# Patient Record
Sex: Male | Born: 1980 | Race: Black or African American | Hispanic: No | Smoking: Current every day smoker
Health system: Southern US, Community
[De-identification: ages and names within clinical notes are randomized; demographics above are authoritative.]

## PROBLEM LIST (undated history)

## (undated) DIAGNOSIS — E119 Type 2 diabetes mellitus without complications: Secondary | ICD-10-CM

---

## 2011-12-12 ENCOUNTER — Encounter (HOSPITAL_BASED_OUTPATIENT_CLINIC_OR_DEPARTMENT_OTHER): Payer: Self-pay | Admitting: Emergency Medicine

## 2011-12-12 ENCOUNTER — Emergency Department (HOSPITAL_BASED_OUTPATIENT_CLINIC_OR_DEPARTMENT_OTHER)
Admission: EM | Admit: 2011-12-12 | Discharge: 2011-12-12 | Disposition: A | Discharge status: Home / Self Care | Payer: Self-pay | Attending: Emergency Medicine | Admitting: Emergency Medicine

## 2011-12-12 DIAGNOSIS — M25469 Effusion, unspecified knee: Secondary | ICD-10-CM | POA: Insufficient documentation

## 2011-12-12 DIAGNOSIS — M7050 Other bursitis of knee, unspecified knee: Secondary | ICD-10-CM

## 2011-12-12 DIAGNOSIS — M25569 Pain in unspecified knee: Secondary | ICD-10-CM | POA: Insufficient documentation

## 2011-12-12 DIAGNOSIS — F17200 Nicotine dependence, unspecified, uncomplicated: Secondary | ICD-10-CM | POA: Insufficient documentation

## 2011-12-12 NOTE — Discharge Instructions (Signed)
Bursitis  Bursitis is a swelling and soreness (inflammation) of a fluid-filled sac (bursa) that overlies and protects a joint. It can be caused by injury, overuse of the joint, arthritis or infection. The joints most likely to be affected are the elbows, shoulders, hips and knees.  HOME CARE INSTRUCTIONS    Apply ice to the affected area for 15 to 20 minutes each hour while awake for 2 days. Put the ice in a plastic bag and place a towel between the bag of ice and your skin.   Rest the injured joint as much as possible, but continue to put the joint through a full range of motion, 4 times per day. (The shoulder joint especially becomes rapidly "frozen" if not used.) When the pain lessens, begin normal slow movements and usual activities.   Only take over-the-counter or prescription medicines for pain, discomfort or fever as directed by your caregiver.   Your caregiver may recommend draining the bursa and injecting medicine into the bursa. This may help the healing process.   Follow all instructions for follow-up with your caregiver. This includes any orthopedic referrals, physical therapy and rehabilitation. Any delay in obtaining necessary care could result in a delay or failure of the bursitis to heal and chronic pain.  SEEK IMMEDIATE MEDICAL CARE IF:    Your pain increases even during treatment.   You develop an oral temperature above 102 F (38.9 C) and have heat and inflammation over the involved bursa.  MAKE SURE YOU:    Understand these instructions.   Will watch your condition.   Will get help right away if you are not doing well or get worse.  Document Released: 09/03/2000 Document Revised: 08/26/2011 Document Reviewed: 08/08/2009  [employer] Patient Information 2012 [employer].

## 2011-12-12 NOTE — ED Notes (Signed)
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

## 2011-12-12 NOTE — ED Provider Notes (Signed)
History     CSN: [id number]  Arrival date & time 12/12/11  0805   First MD Initiated Contact with Patient 12/12/11 [phone]      Chief Complaint  Patient presents with  . Knee Pain    (Consider location/radiation/quality/duration/timing/severity/associated sxs/prior treatment) HPI  Right knee stiff and painful began and Wednesday.  Noted on awakening.  Improved during day- comes and goes.  Worsens with movements.  Patient denies any specific injury but states twists and turns in small kitchen.  Possible swelling, no skin trauma or  Redness or warmth.  No previous problems.   History reviewed. No pertinent past medical history.  History reviewed. No pertinent past surgical history.  No family history on file.  History  Substance Use Topics  . Smoking status: Current Everyday Smoker  . Smokeless tobacco: Not on file  . Alcohol Use: Yes     social      Review of Systems  All other systems reviewed and are negative.    Allergies  Review of patient's allergies indicates no known allergies.  Home Medications  No current outpatient prescriptions on file.  BP 143/87  Pulse 86  Temp(Src) 98.1 F (36.7 C) (Oral)  Resp 16  Ht 6\' 1"  (1.854 m)  Wt 275 lb (124.739 kg)  BMI 36.28 kg/m2  SpO2 97%  Physical Exam  Nursing note and vitals reviewed. Constitutional: He appears well-developed and well-nourished.  HENT:  Head: Normocephalic and atraumatic.  Eyes: Conjunctivae are normal. Pupils are equal, round, and reactive to light.  Neck: Normal range of motion. Neck supple.  Cardiovascular: Normal rate and regular rhythm.   Pulmonary/Chest: Effort normal and breath sounds normal.  Abdominal: Soft. Bowel sounds are normal.  Musculoskeletal: Normal range of motion. He exhibits no edema.       Patient with mild swelling and tenderness under patellar ligament.  No joint swelling or redness or warmth.  No posterior tenerness.  Neurological: He is alert.  Skin: Skin is warm.    Psychiatric: He has a normal mood and affect.    ED Course  Procedures (including critical care time)  Labs Reviewed - No data to display No results found.   No diagnosis found.    MDM          Hilario Quarry, MD 12/19/11 [phone]

## 2013-03-29 ENCOUNTER — Encounter (HOSPITAL_BASED_OUTPATIENT_CLINIC_OR_DEPARTMENT_OTHER): Payer: Self-pay | Admitting: *Deleted

## 2013-03-29 ENCOUNTER — Emergency Department (HOSPITAL_BASED_OUTPATIENT_CLINIC_OR_DEPARTMENT_OTHER)
Admission: EM | Admit: 2013-03-29 | Discharge: 2013-03-29 | Disposition: A | Discharge status: Home / Self Care | Payer: Self-pay | Attending: Emergency Medicine | Admitting: Emergency Medicine

## 2013-03-29 DIAGNOSIS — F17200 Nicotine dependence, unspecified, uncomplicated: Secondary | ICD-10-CM | POA: Insufficient documentation

## 2013-03-29 DIAGNOSIS — K029 Dental caries, unspecified: Secondary | ICD-10-CM | POA: Insufficient documentation

## 2013-03-29 MED ORDER — HYDROCODONE-ACETAMINOPHEN 5-325 MG PO TABS: 2.0000 | ORAL_TABLET | ORAL | Status: DC | PRN

## 2013-03-29 MED ORDER — PENICILLIN V POTASSIUM 500 MG PO TABS: 500.0000 mg | ORAL_TABLET | Freq: Four times a day (QID) | ORAL | Status: AC

## 2013-03-29 NOTE — ED Provider Notes (Signed)
   History    CSN: [id number] Arrival date & time 03/29/13  0127  None    Chief Complaint  Patient presents with  . Dental Pain   (Consider location/radiation/quality/duration/timing/severity/associated sxs/prior Treatment) Patient is a 32 y.o. male presenting with tooth pain. The history is provided by the patient.  Dental Pain Location:  Upper Upper teeth location:  1/RU 3rd molar, 2/RU 2nd molar, 3/RU 1st molar, 4/RU 2nd bicuspid and 5/RU 1st bicuspid Quality:  Aching Severity:  Severe Onset quality:  Gradual Duration:  2 days Timing:  Constant Progression:  Unchanged Chronicity:  New Context: dental fracture and poor dentition   Context: not abscess   Relieved by:  Nothing Worsened by:  Nothing tried Ineffective treatments:  None tried Associated symptoms: no congestion, no fever and no neck swelling   Risk factors: smoking    History reviewed. No pertinent past medical history. History reviewed. No pertinent past surgical history. No family history on file. History  Substance Use Topics  . Smoking status: Current Every Day Smoker  . Smokeless tobacco: Not on file  . Alcohol Use: Yes     Comment: social    Review of Systems  Constitutional: Negative for fever.  HENT: Negative for congestion.   All other systems reviewed and are negative.    Allergies  Review of patient's allergies indicates no known allergies.  Home Medications   Current Outpatient Rx  Name  Route  Sig  Dispense  Refill  . HYDROcodone-acetaminophen (NORCO) 5-325 MG per tablet   Oral   Take 2 tablets by mouth every 4 (four) hours as needed for pain.   10 tablet   0   . penicillin v potassium (VEETID) 500 MG tablet   Oral   Take 1 tablet (500 mg total) by mouth 4 (four) times daily.   40 tablet   0    BP 167/100  Pulse 68  Temp(Src) 98.7 F (37.1 C) (Oral)  Resp 18  Ht 6\' 1"  (1.854 m)  Wt 260 lb (117.935 kg)  BMI 34.31 kg/m2  SpO2 100% Physical Exam  Constitutional: He  is oriented to person, place, and time. He appears well-developed and well-nourished.  HENT:  Head: Normocephalic.  Mouth/Throat: Oropharynx is clear and moist. Dental caries present. No oropharyngeal exudate.    Eyes: Conjunctivae are normal. Pupils are equal, round, and reactive to light.  Neck: Normal range of motion. Neck supple.  Cardiovascular: Normal rate, regular rhythm and intact distal pulses.   Pulmonary/Chest: Effort normal and breath sounds normal. He has no wheezes. He has no rales.  Abdominal: Soft. Bowel sounds are normal. There is no tenderness. There is no rebound and no guarding.  Musculoskeletal: Normal range of motion.  Neurological: He is alert and oriented to person, place, and time.  Skin: Skin is warm and dry.  Psychiatric: He has a normal mood and affect.    ED Course  Procedures (including critical care time) Labs Reviewed - No data to display No results found. 1. Dental caries     MDM  Antibiotics pain medication and follow up with a dentist.   Kortnee Bas K Koa Zoeller-Rasch, MD 03/29/13 0139

## 2013-03-29 NOTE — ED Notes (Signed)
Pt c/o right upper molar pain thatis radiating into his right ear x2 days. Pt sts he has a cracked tooth.

## 2013-03-29 NOTE — ED Notes (Signed)
MD at bedside. 

## 2014-08-07 ENCOUNTER — Emergency Department (HOSPITAL_BASED_OUTPATIENT_CLINIC_OR_DEPARTMENT_OTHER): Payer: Self-pay

## 2014-08-07 ENCOUNTER — Emergency Department (HOSPITAL_BASED_OUTPATIENT_CLINIC_OR_DEPARTMENT_OTHER)
Admission: EM | Admit: 2014-08-07 | Discharge: 2014-08-07 | Disposition: A | Discharge status: Home / Self Care | Payer: Self-pay | Attending: Emergency Medicine | Admitting: Emergency Medicine

## 2014-08-07 ENCOUNTER — Encounter (HOSPITAL_BASED_OUTPATIENT_CLINIC_OR_DEPARTMENT_OTHER): Payer: Self-pay

## 2014-08-07 DIAGNOSIS — T1490XA Injury, unspecified, initial encounter: Secondary | ICD-10-CM

## 2014-08-07 DIAGNOSIS — Y9389 Activity, other specified: Secondary | ICD-10-CM | POA: Insufficient documentation

## 2014-08-07 DIAGNOSIS — Z720 Tobacco use: Secondary | ICD-10-CM | POA: Insufficient documentation

## 2014-08-07 DIAGNOSIS — Y998 Other external cause status: Secondary | ICD-10-CM | POA: Insufficient documentation

## 2014-08-07 DIAGNOSIS — Y9289 Other specified places as the place of occurrence of the external cause: Secondary | ICD-10-CM | POA: Insufficient documentation

## 2014-08-07 DIAGNOSIS — S9032XA Contusion of left foot, initial encounter: Secondary | ICD-10-CM | POA: Insufficient documentation

## 2014-08-07 DIAGNOSIS — W208XXA Other cause of strike by thrown, projected or falling object, initial encounter: Secondary | ICD-10-CM | POA: Insufficient documentation

## 2014-08-07 MED ORDER — NAPROXEN 500 MG PO TABS: 500.0000 mg | ORAL_TABLET | Freq: Two times a day (BID) | ORAL | Status: DC

## 2014-08-07 MED ORDER — HYDROCODONE-ACETAMINOPHEN 5-325 MG PO TABS: 1.0000 | ORAL_TABLET | ORAL | Status: DC | PRN

## 2014-08-07 NOTE — ED Provider Notes (Signed)
CSN: [id number]     Arrival date & time 08/07/14  1602 History   First MD Initiated Contact with Patient 08/07/14 1630     Chief Complaint  Patient presents with  . Foot Injury     (Consider location/radiation/quality/duration/timing/severity/associated sxs/prior Treatment) Patient is a 33 y.o. male presenting with foot injury. The history is provided by the patient.  Foot Injury Location:  Foot Injury: yes   Foot location:  L foot Pain details:    Radiates to:  Does not radiate   Severity:  Severe   Onset quality:  Sudden   Timing:  Constant   Progression:  Worsening [name] [name] is a 33 y.o. male who presents to the ED with pain to the left foot after he dropped a bath tub on his foot 24 hours earlier. He denies any other injuries.   History reviewed. No pertinent past medical history. History reviewed. No pertinent past surgical history. No family history on file. History  Substance Use Topics  . Smoking status: Current Every Day Smoker  . Smokeless tobacco: Not on file  . Alcohol Use: No    Review of Systems Negative except as stated in HPI   Allergies  Review of patient's allergies indicates no known allergies.  Home Medications   Prior to Admission medications   Not on File   BP 163/99 mmHg  Pulse 86  Temp(Src) 98.6 F (37 C) (Oral)  Resp 18  Ht 6\' 1"  (1.854 m)  Wt 247 lb (112.038 kg)  BMI 32.59 kg/m2  SpO2 97% Physical Exam  Constitutional: He is oriented to person, place, and time. He appears well-developed and well-nourished.  HENT:  Head: Normocephalic.  Eyes: EOM are normal.  Neck: Neck supple.  Cardiovascular: Normal rate.   Pulmonary/Chest: Effort normal.  Musculoskeletal: Normal range of motion.       Left foot: There is tenderness and swelling. There is normal range of motion, normal capillary refill, no deformity and no laceration.       Feet:  Tender with ecchymosis to the medial aspect of the left foot. Pedal pulse strong,  adequate circulation, good touch sensation.   Neurological: He is alert and oriented to person, place, and time. No cranial nerve deficit.  Skin: Skin is warm and dry.  Psychiatric: He has a normal mood and affect. His behavior is normal.  Nursing note and vitals reviewed.   ED Course  Procedures ( Buddy tape, ace wrap, post op shoe, ice, elevate and follow up with ortho is symptoms persist.   Dg Foot Complete Left  08/07/2014   CLINICAL DATA:  Left foot pain after a heavy object fell on foot 2 days ago. Lateral great toe pain with abrasion and swelling. Initial encounter.  EXAM: LEFT FOOT - COMPLETE 3+ VIEW  COMPARISON:  None.  FINDINGS: On the lateral radiograph, there is question of a cortical disruption involving the dorsal base of the second toe distal phalanx, however a fracture is not confirmed in other projections and this appearance may be artifactual due to other superimposed tissues. There is no other evidence of acute fracture, and specifically the great toe appears intact. There is no dislocation. No radiopaque foreign body. No lytic or blastic osseous lesion.  IMPRESSION: Artifact versus nondisplaced fracture of the second toe distal phalanx. No acute osseous abnormality identified elsewhere.   Electronically Signed   By: [name] [name]   On: 08/07/2014 17:01    MDM  33 y.o. male with left foot pain  s/p injury 24 hours prior to visit. Stable for discharge with no neurovascular deficits. I have reviewed this patient's vital signs, nurses notes, appropriate imaging and discussed findings and plan of care with the patient. He voices understanding and agrees with plan.        [address] Brown StreetHope Palos HillsM Neese, NP 08/08/14 1605  [name] CamelScott T Goldston, MD 08/14/14 [phone]

## 2014-08-07 NOTE — ED Notes (Signed)
Bathtub fell on left foot yesterday-denies as WC claim

## 2014-08-07 NOTE — Discharge Instructions (Signed)
Do not take the narcotic if you are driving as it will make you sleepy.  °

## 2014-08-07 NOTE — ED Notes (Signed)
Patient preparing for discharge. 

## 2014-08-08 ENCOUNTER — Encounter (HOSPITAL_BASED_OUTPATIENT_CLINIC_OR_DEPARTMENT_OTHER): Payer: Self-pay | Admitting: *Deleted

## 2014-08-09 NOTE — ED Notes (Signed)
Patient called and requested a note to return to work after his scheduled appointment with Dr. Pearletha ForgeHudnall on 08/12/14

## 2014-08-12 ENCOUNTER — Ambulatory Visit: Payer: Self-pay | Admitting: Family Medicine

## 2015-03-14 ENCOUNTER — Encounter (HOSPITAL_BASED_OUTPATIENT_CLINIC_OR_DEPARTMENT_OTHER): Payer: Self-pay | Admitting: Emergency Medicine

## 2015-03-14 ENCOUNTER — Emergency Department (HOSPITAL_BASED_OUTPATIENT_CLINIC_OR_DEPARTMENT_OTHER)
Admission: EM | Admit: 2015-03-14 | Discharge: 2015-03-14 | Disposition: A | Discharge status: Home / Self Care | Payer: 59 | Attending: Emergency Medicine | Admitting: Emergency Medicine

## 2015-03-14 DIAGNOSIS — H9202 Otalgia, left ear: Secondary | ICD-10-CM | POA: Diagnosis present

## 2015-03-14 DIAGNOSIS — Z720 Tobacco use: Secondary | ICD-10-CM | POA: Diagnosis not present

## 2015-03-14 DIAGNOSIS — Z791 Long term (current) use of non-steroidal anti-inflammatories (NSAID): Secondary | ICD-10-CM | POA: Insufficient documentation

## 2015-03-14 DIAGNOSIS — H6092 Unspecified otitis externa, left ear: Secondary | ICD-10-CM | POA: Insufficient documentation

## 2015-03-14 MED ORDER — CIPROFLOXACIN-DEXAMETHASONE 0.3-0.1 % OT SUSP
4.0000 [drp] | Freq: Once | OTIC | Status: AC
  Administered 2015-03-14: 4 [drp] via OTIC
  Filled 2015-03-14: qty 7.5

## 2015-03-14 MED ORDER — NAPROXEN 500 MG PO TABS: 500.0000 mg | ORAL_TABLET | Freq: Two times a day (BID) | ORAL | Status: DC

## 2015-03-14 MED ORDER — IBUPROFEN 800 MG PO TABS
800.0000 mg | ORAL_TABLET | Freq: Once | ORAL | Status: AC
  Administered 2015-03-14: 800 mg via ORAL
  Filled 2015-03-14: qty 1

## 2015-03-14 NOTE — Discharge Instructions (Signed)
Take ciprodex twice daily for 5 days.   Take naprosyn for pain.   Avoid swimming when your ear hurts.  Follow up with your doctor.   Return to ER if you have worse ear pain, fever, purulent drainage from ear.

## 2015-03-14 NOTE — ED Provider Notes (Signed)
CSN: [id number]     Arrival date & time 03/14/15  0027 History   First MD Initiated Contact with Patient 03/14/15 [phone]     Chief Complaint  Patient presents with  . Otalgia     (Consider location/radiation/quality/duration/timing/severity/associated sxs/prior Treatment) The history is provided by the patient.  [name] [name] is a 34 y.o. male here presenting with left ear pain. Left ear pain for the last 5 days. Initially comes and goes but went swimming and got worse. Tried over-the-counter medicine but made it worse. He tried to go to bed today by the year pain woke him up. Denies any fevers or purulent drainage.  History reviewed. No pertinent past medical history. History reviewed. No pertinent past surgical history. History reviewed. No pertinent family history. History  Substance Use Topics  . Smoking status: Current Every Day Smoker -- 1.00 packs/day    Types: Cigarettes  . Smokeless tobacco: Not on file  . Alcohol Use: Yes     Comment: social    Review of Systems  HENT: Positive for ear pain.   All other systems reviewed and are negative.     Allergies  Review of patient's allergies indicates no known allergies.  Home Medications   Prior to Admission medications   Medication Sig Start Date End Date Taking? Authorizing Provider  HYDROcodone-acetaminophen (NORCO) 5-325 MG per tablet Take 2 tablets by mouth every 4 (four) hours as needed for pain. 03/29/13   April Palumbo, MD  HYDROcodone-acetaminophen (NORCO/VICODIN) 5-325 MG per tablet Take 1-2 tablets by mouth every 4 (four) hours as needed. 08/07/14   Hope Orlene Och, NP  naproxen (NAPROSYN) 500 MG tablet Take 1 tablet (500 mg total) by mouth 2 (two) times daily. 08/07/14   Hope Orlene Och, NP   BP 168/91 mmHg  Pulse 80  Temp(Src) 98.1 F (36.7 C) (Oral)  Resp 16  Ht 6\' 1"  (1.854 m)  Wt 250 lb (113.399 kg)  BMI 32.99 kg/m2  SpO2 98% Physical Exam  Constitutional: He is oriented to person, place, and time. He  appears well-developed and well-nourished.  HENT:  Head: Normocephalic.  Mouth/Throat: Oropharynx is clear and moist.  Bilateral TM nl. L canal with slight erythema, ? Mild otitis externa   Eyes: Conjunctivae are normal. Pupils are equal, round, and reactive to light.  Neck: Normal range of motion. Neck supple.  Cardiovascular: Normal rate, regular rhythm, normal heart sounds and intact distal pulses.   Pulmonary/Chest: Effort normal and breath sounds normal. No respiratory distress.  Abdominal: Soft.  Musculoskeletal: Normal range of motion.  Neurological: He is alert and oriented to person, place, and time.  Skin: Skin is warm and dry.  Psychiatric: He has a normal mood and affect. His behavior is normal. Judgment and thought content normal.  Nursing note and vitals reviewed.   ED Course  Procedures (including critical care time) Labs Review Labs Reviewed - No data to display  Imaging Review No results found.   EKG Interpretation None      MDM   Final diagnoses:  None   [name] [name] is a 34 y.o. male here with L ear pain. No signs of otitis media. ? L otitis externa vs irritation. Given ciprodex, motrin. Will dc home with same.      Richardean Canal, MD 03/14/15 [phone]

## 2015-03-14 NOTE — ED Notes (Signed)
Patient reports left ear pain x 5 days.  Reports this went away and then came back after going swimming. Reports he used OTC ear medicine which has since made it worse.

## 2016-04-26 ENCOUNTER — Encounter (HOSPITAL_BASED_OUTPATIENT_CLINIC_OR_DEPARTMENT_OTHER): Payer: Self-pay

## 2016-04-26 ENCOUNTER — Emergency Department (HOSPITAL_BASED_OUTPATIENT_CLINIC_OR_DEPARTMENT_OTHER): Payer: 59

## 2016-04-26 ENCOUNTER — Inpatient Hospital Stay (HOSPITAL_BASED_OUTPATIENT_CLINIC_OR_DEPARTMENT_OTHER)
Admission: EM | Admit: 2016-04-26 | Discharge: 2016-04-30 | DRG: 392 | Disposition: A | Discharge status: Home / Self Care | Payer: 59 | Attending: General Surgery | Admitting: General Surgery

## 2016-04-26 DIAGNOSIS — Z833 Family history of diabetes mellitus: Secondary | ICD-10-CM

## 2016-04-26 DIAGNOSIS — F17210 Nicotine dependence, cigarettes, uncomplicated: Secondary | ICD-10-CM | POA: Diagnosis present

## 2016-04-26 DIAGNOSIS — D72829 Elevated white blood cell count, unspecified: Secondary | ICD-10-CM | POA: Diagnosis present

## 2016-04-26 DIAGNOSIS — E119 Type 2 diabetes mellitus without complications: Secondary | ICD-10-CM | POA: Diagnosis present

## 2016-04-26 DIAGNOSIS — K5720 Diverticulitis of large intestine with perforation and abscess without bleeding: Principal | ICD-10-CM | POA: Diagnosis present

## 2016-04-26 DIAGNOSIS — K5792 Diverticulitis of intestine, part unspecified, without perforation or abscess without bleeding: Secondary | ICD-10-CM | POA: Diagnosis present

## 2016-04-26 DIAGNOSIS — E669 Obesity, unspecified: Secondary | ICD-10-CM | POA: Diagnosis present

## 2016-04-26 DIAGNOSIS — Z6832 Body mass index (BMI) 32.0-32.9, adult: Secondary | ICD-10-CM

## 2016-04-26 DIAGNOSIS — K5780 Diverticulitis of intestine, part unspecified, with perforation and abscess without bleeding: Secondary | ICD-10-CM

## 2016-04-26 DIAGNOSIS — K5700 Diverticulitis of small intestine with perforation and abscess without bleeding: Secondary | ICD-10-CM

## 2016-04-26 HISTORY — DX: Type 2 diabetes mellitus without complications: E11.9

## 2016-04-26 LAB — URINALYSIS, ROUTINE W REFLEX MICROSCOPIC
BILIRUBIN URINE: NEGATIVE
Glucose, UA: NEGATIVE mg/dL
Ketones, ur: NEGATIVE mg/dL
Leukocytes, UA: NEGATIVE
Nitrite: NEGATIVE
PROTEIN: NEGATIVE mg/dL
Specific Gravity, Urine: 1.007 (ref 1.005–1.030)
pH: 6 (ref 5.0–8.0)

## 2016-04-26 LAB — COMPREHENSIVE METABOLIC PANEL
ALT: 29 U/L (ref 17–63)
AST: 24 U/L (ref 15–41)
Albumin: 3.8 g/dL (ref 3.5–5.0)
Alkaline Phosphatase: 96 U/L (ref 38–126)
Anion gap: 10 (ref 5–15)
BILIRUBIN TOTAL: 0.7 mg/dL (ref 0.3–1.2)
BUN: 13 mg/dL (ref 6–20)
CALCIUM: 9.1 mg/dL (ref 8.9–10.3)
CO2: 27 mmol/L (ref 22–32)
CREATININE: 1.18 mg/dL (ref 0.61–1.24)
Chloride: 95 mmol/L — ABNORMAL LOW (ref 101–111)
GFR calc Af Amer: >60 mL/min (ref >60)
Glucose, Bld: 131 mg/dL — ABNORMAL HIGH (ref 65–99)
Potassium: 3.7 mmol/L (ref 3.5–5.1)
Sodium: 132 mmol/L — ABNORMAL LOW (ref 135–145)
TOTAL PROTEIN: 8.3 g/dL — AB (ref 6.5–8.1)

## 2016-04-26 LAB — CBC WITH DIFFERENTIAL/PLATELET
BAND NEUTROPHILS: 6 %
BASOS PCT: 0 %
Basophils Absolute: 0 10*3/uL (ref 0.0–0.1)
EOS ABS: 0 10*3/uL (ref 0.0–0.7)
Eosinophils Relative: 0 %
HCT: 47.2 % (ref 39.0–52.0)
Hemoglobin: 15.8 g/dL (ref 13.0–17.0)
LYMPHS ABS: 3.1 10*3/uL (ref 0.7–4.0)
Lymphocytes Relative: 18 %
MCH: 28.4 pg (ref 26.0–34.0)
MCHC: 33.5 g/dL (ref 30.0–36.0)
MCV: 84.9 fL (ref 78.0–100.0)
MONO ABS: 1 10*3/uL (ref 0.1–1.0)
Monocytes Relative: 6 %
Neutro Abs: 12.9 10*3/uL — ABNORMAL HIGH (ref 1.7–7.7)
Neutrophils Relative %: 70 %
Platelets: 271 10*3/uL (ref 150–400)
RBC: 5.56 MIL/uL (ref 4.22–5.81)
RDW: 13.2 % (ref 11.5–15.5)
WBC: 17 10*3/uL — ABNORMAL HIGH (ref 4.0–10.5)

## 2016-04-26 LAB — URINE MICROSCOPIC-ADD ON: WBC, UA: NONE SEEN WBC/hpf (ref 0–5)

## 2016-04-26 MED ORDER — ACETAMINOPHEN 325 MG PO TABS
650.0000 mg | ORAL_TABLET | Freq: Once | ORAL | Status: AC
  Administered 2016-04-26: 650 mg via ORAL
  Filled 2016-04-26: qty 2

## 2016-04-26 NOTE — ED Notes (Signed)
MD at bedside. 

## 2016-04-26 NOTE — ED Triage Notes (Signed)
C/o abd pain, constipation x 4 days-used OTC laxatives with relief yesterday-NAD-steady gait

## 2016-04-26 NOTE — ED Provider Notes (Signed)
[facility]   CSN: [id number] Arrival date & time: 04/26/16  1925  First Provider Contact:  First MD Initiated Contact with Patient 04/26/16 2242     By signing my name below, I, [name] [name], attest that this documentation has been prepared under the direction and in the presence of Doug SouSam Danylle Ouk, MD. Electronically Signed: Soijett [name], ED Scribe. 04/26/16. 11:00 PM.    History   Chief Complaint Chief Complaint  Patient presents with  . Abdominal Pain    HPI [name] [name] is a 35 y.o. male who presents to the Emergency Department complaining of intermittent left Sided abdominal pain onset 1 week.  Pt states that his abdominal pain is alleviated with a bowel movement. Pt notes that his last bowel movement was yesterday. He states that he is having associated symptoms of constipation. Pt reports that he hasn't passed gas in 4 days and he notes that he has a moderate amount of gas currently. He feels much improved since having a bowel movement here He states that he has tried OTC milk of magnesia with his last dose being yesterday for the relief for his symptoms. Denies  He denies nausea, vomiting, appetite change, no urinary symptoms and any other symptoms. Pt notes that he does smoke cigarettes. Pt endorses ETOH use with his last use 3 days ago. Pt denies illegal drug use. Denies taking any daily medications. Denies allergies to medications. Pt denies having a PCP.    The history is provided by the patient. No language interpreter was used.    History reviewed. No pertinent past medical history.  There are no active problems to display for this patient.   History reviewed. No pertinent surgical history.     Home Medications    Prior to Admission medications   Not on File   Milk of magnesia Family History No family history on file.  Social History Social History  Substance Use Topics  . Smoking status: Current Every Day Smoker   Packs/day: 1.00    Types: Cigarettes  . Smokeless tobacco: Never Used  . Alcohol use Yes     Comment: occ    Denies drug use Allergies   Review of patient's allergies indicates no known allergies.   Review of Systems Review of Systems  Constitutional: Negative.   HENT: Negative.   Respiratory: Negative.   Cardiovascular: Negative.   Gastrointestinal: Positive for abdominal pain and constipation.  Musculoskeletal: Negative.   Skin: Negative.   Neurological: Negative.   Psychiatric/Behavioral: Negative.   All other systems reviewed and are negative.    Physical Exam Updated Vital Signs BP 144/78 (BP Location: Right Arm)   Pulse 110   Temp 101.3 F (38.5 C) (Oral)   Resp 18   Ht 6\' 1"  (1.854 m)   Wt 246 lb (111.6 kg)   SpO2 98%   BMI 32.46 kg/m   Physical Exam  Constitutional: He appears well-developed and well-nourished. No distress.  HENT:  Head: Normocephalic and atraumatic.  Eyes: Conjunctivae are normal. Pupils are equal, round, and reactive to light.  Neck: Neck supple. No tracheal deviation present. No thyromegaly present.  Cardiovascular: Regular rhythm.   No murmur heard. Mildly tachycardic  Pulmonary/Chest: Effort normal and breath sounds normal.  Abdominal: Soft. Bowel sounds are normal. He exhibits no distension. There is no tenderness.  Obese  Genitourinary: Penis normal.  Genitourinary Comments: Scrotum normal  Musculoskeletal: Normal range of motion. He exhibits no edema or tenderness.  Neurological: He is  alert. Coordination normal.  Skin: Skin is warm and dry. No rash noted.  Psychiatric: He has a normal mood and affect.  Nursing note and vitals reviewed.    ED Treatments / Results  DIAGNOSTIC STUDIES: Oxygen Saturation is 98% on RA, nl by my interpretation.    COORDINATION OF CARE: 10:59 PM Discussed treatment plan with pt at bedside which includes labs, UA, abdomen xray, CT abdomen with pelvis, and pt agreed to plan.   Labs (all  labs ordered are listed, but only abnormal results are displayed) Labs Reviewed  CBC WITH DIFFERENTIAL/PLATELET - Abnormal; Notable for the following:       Result Value   WBC 17.0 (*)    Neutro Abs 12.9 (*)    All other components within normal limits  COMPREHENSIVE METABOLIC PANEL - Abnormal; Notable for the following:    Sodium 132 (*)    Chloride 95 (*)    Glucose, Bld 131 (*)    Total Protein 8.3 (*)    All other components within normal limits  URINALYSIS, ROUTINE W REFLEX MICROSCOPIC (NOT AT [facility]) - Abnormal; Notable for the following:    Hgb urine dipstick MODERATE (*)    All other components within normal limits  URINE MICROSCOPIC-ADD ON - Abnormal; Notable for the following:    Squamous Epithelial / LPF 0-5 (*)    Bacteria, UA FEW (*)    All other components within normal limits    EKG  EKG Interpretation None       Radiology Dg Abdomen 1 View  Result Date: 04/26/2016 CLINICAL DATA:  Constipation.  Lower abdominal pain. EXAM: ABDOMEN - 1 VIEW COMPARISON:  None. FINDINGS: Upper abdomen not included in the field of view. Small volume of colonic stool. No small bowel dilatation to suggest obstruction. No abnormal calcifications. Included osseous structures are intact. IMPRESSION: Small stool burden.  No evidence of obstruction. Electronically Signed   By: Rubye Oaks M.D.   On: 04/26/2016 21:18    Procedures Procedures (including critical care time)  Medications Ordered in ED Medications  acetaminophen (TYLENOL) tablet 650 mg (650 mg Oral Given 04/26/16 2153)    Results for orders placed or performed during the hospital encounter of 04/26/16  CBC with Differential  Result Value Ref Range   WBC 17.0 (H) 4.0 - 10.5 K/uL   RBC 5.56 4.22 - 5.81 MIL/uL   Hemoglobin 15.8 13.0 - 17.0 g/dL   HCT 04.5 40.9 - 81.1 %   MCV 84.9 78.0 - 100.0 fL   MCH 28.4 26.0 - 34.0 pg   MCHC 33.5 30.0 - 36.0 g/dL   RDW 91.4 78.2 - 95.6 %   Platelets 271 150 - 400 K/uL    Neutrophils Relative % 70 %   Lymphocytes Relative 18 %   Monocytes Relative 6 %   Eosinophils Relative 0 %   Basophils Relative 0 %   Band Neutrophils 6 %   Neutro Abs 12.9 (H) 1.7 - 7.7 K/uL   Lymphs Abs 3.1 0.7 - 4.0 K/uL   Monocytes Absolute 1.0 0.1 - 1.0 K/uL   Eosinophils Absolute 0.0 0.0 - 0.7 K/uL   Basophils Absolute 0.0 0.0 - 0.1 K/uL   WBC Morphology ATYPICAL LYMPHOCYTES   Comprehensive metabolic panel  Result Value Ref Range   Sodium 132 (L) 135 - 145 mmol/L   Potassium 3.7 3.5 - 5.1 mmol/L   Chloride 95 (L) 101 - 111 mmol/L   CO2 27 22 - 32 mmol/L   Glucose, Bld  131 (H) 65 - 99 mg/dL   BUN 13 6 - 20 mg/dL   Creatinine, Ser 9.52 0.61 - 1.24 mg/dL   Calcium 9.1 8.9 - 84.1 mg/dL   Total Protein 8.3 (H) 6.5 - 8.1 g/dL   Albumin 3.8 3.5 - 5.0 g/dL   AST 24 15 - 41 U/L   ALT 29 17 - 63 U/L   Alkaline Phosphatase 96 38 - 126 U/L   Total Bilirubin 0.7 0.3 - 1.2 mg/dL   GFR calc non Af Amer >60 >60 mL/min   GFR calc Af Amer >60 >60 mL/min   Anion gap 10 5 - 15  Urinalysis, Routine w reflex microscopic (not at [facility])  Result Value Ref Range   Color, Urine YELLOW YELLOW   APPearance CLEAR CLEAR   Specific Gravity, Urine 1.007 1.005 - 1.030   pH 6.0 5.0 - 8.0   Glucose, UA NEGATIVE NEGATIVE mg/dL   Hgb urine dipstick MODERATE (A) NEGATIVE   Bilirubin Urine NEGATIVE NEGATIVE   Ketones, ur NEGATIVE NEGATIVE mg/dL   Protein, ur NEGATIVE NEGATIVE mg/dL   Nitrite NEGATIVE NEGATIVE   Leukocytes, UA NEGATIVE NEGATIVE  Urine microscopic-add on  Result Value Ref Range   Squamous Epithelial / LPF 0-5 (A) NONE SEEN   WBC, UA NONE SEEN 0 - 5 WBC/hpf   RBC / HPF 0-5 0 - 5 RBC/hpf   Bacteria, UA FEW (A) NONE SEEN   Dg Abdomen 1 View  Result Date: 04/26/2016 CLINICAL DATA:  Constipation.  Lower abdominal pain. EXAM: ABDOMEN - 1 VIEW COMPARISON:  None. FINDINGS: Upper abdomen not included in the field of view. Small volume of colonic stool. No small bowel dilatation to suggest  obstruction. No abnormal calcifications. Included osseous structures are intact. IMPRESSION: Small stool burden.  No evidence of obstruction. Electronically Signed   By: Rubye Oaks M.D.   On: 04/26/2016 21:18   Initial Impression / Assessment and Plan / ED Course  I have reviewed the triage vital signs and the nursing notes.  Pertinent labs & imaging results that were available during my care of the patient were reviewed by me and considered in my medical decision making (see chart for details).  Clinical Course  In light of left-sided abdominal pain leukocytosis and fever CT scan of abdomen and pelvis ordered. Pt signed out to Dr Nicanor Alcon at 1210 am    Final Clinical Impressions(s) / ED Diagnoses   Final diagnoses:  None    New Prescriptions New Prescriptions   No medications on file  Dx #1 abdominal pain #2 fever #3 leukocytosis       Doug Sou, MD 04/27/16 3244

## 2016-04-27 DIAGNOSIS — K5792 Diverticulitis of intestine, part unspecified, without perforation or abscess without bleeding: Secondary | ICD-10-CM | POA: Diagnosis present

## 2016-04-27 LAB — CBC
HEMATOCRIT: 43.4 % (ref 39.0–52.0)
Hemoglobin: 14.2 g/dL (ref 13.0–17.0)
MCH: 28.1 pg (ref 26.0–34.0)
MCHC: 32.7 g/dL (ref 30.0–36.0)
MCV: 85.8 fL (ref 78.0–100.0)
Platelets: 279 10*3/uL (ref 150–400)
RBC: 5.06 MIL/uL (ref 4.22–5.81)
RDW: 13.4 % (ref 11.5–15.5)
WBC: 16.1 10*3/uL — ABNORMAL HIGH (ref 4.0–10.5)

## 2016-04-27 LAB — CREATININE, SERUM
Creatinine, Ser: 1.13 mg/dL (ref 0.61–1.24)
GFR calc Af Amer: >60 mL/min (ref >60)

## 2016-04-27 MED ORDER — PIPERACILLIN-TAZOBACTAM 3.375 G IVPB
3.3750 g | Freq: Three times a day (TID) | INTRAVENOUS | Status: DC
  Administered 2016-04-27 – 2016-04-30 (×10): 3.375 g via INTRAVENOUS
  Filled 2016-04-27 (×11): qty 50

## 2016-04-27 MED ORDER — DIPHENHYDRAMINE HCL 50 MG/ML IJ SOLN: 25.0000 mg | Freq: Four times a day (QID) | INTRAMUSCULAR | Status: DC | PRN

## 2016-04-27 MED ORDER — DIPHENHYDRAMINE HCL 25 MG PO CAPS: 25.0000 mg | ORAL_CAPSULE | Freq: Four times a day (QID) | ORAL | Status: DC | PRN

## 2016-04-27 MED ORDER — ACETAMINOPHEN 325 MG PO TABS: 650.0000 mg | ORAL_TABLET | Freq: Four times a day (QID) | ORAL | Status: DC | PRN

## 2016-04-27 MED ORDER — IOPAMIDOL (ISOVUE-300) INJECTION 61%
100.0000 mL | Freq: Once | INTRAVENOUS | Status: AC | PRN
  Administered 2016-04-27: 100 mL via INTRAVENOUS

## 2016-04-27 MED ORDER — KETOROLAC TROMETHAMINE 30 MG/ML IJ SOLN: 30.0000 mg | Freq: Four times a day (QID) | INTRAMUSCULAR | Status: DC | PRN

## 2016-04-27 MED ORDER — ACETAMINOPHEN 650 MG RE SUPP
650.0000 mg | Freq: Four times a day (QID) | RECTAL | Status: DC | PRN
Start: 2016-04-27 — End: 2016-04-30

## 2016-04-27 MED ORDER — ACETAMINOPHEN 500 MG PO TABS
1000.0000 mg | ORAL_TABLET | Freq: Once | ORAL | Status: AC
  Administered 2016-04-27: 1000 mg via ORAL
  Filled 2016-04-27: qty 2

## 2016-04-27 MED ORDER — ENOXAPARIN SODIUM 40 MG/0.4ML ~~LOC~~ SOLN
40.0000 mg | SUBCUTANEOUS | Status: DC
  Filled 2016-04-27 (×2): qty 0.4

## 2016-04-27 MED ORDER — ONDANSETRON HCL 4 MG/2ML IJ SOLN: 4.0000 mg | Freq: Three times a day (TID) | INTRAMUSCULAR | Status: DC | PRN

## 2016-04-27 MED ORDER — PIPERACILLIN-TAZOBACTAM 3.375 G IVPB 30 MIN
3.3750 g | Freq: Once | INTRAVENOUS | Status: AC
  Administered 2016-04-27: 3.375 g via INTRAVENOUS
  Filled 2016-04-27 (×2): qty 50

## 2016-04-27 MED ORDER — ONDANSETRON HCL 4 MG/2ML IJ SOLN
4.0000 mg | Freq: Four times a day (QID) | INTRAMUSCULAR | Status: AC
  Administered 2016-04-27: 4 mg via INTRAVENOUS
  Filled 2016-04-27: qty 2

## 2016-04-27 MED ORDER — SODIUM CHLORIDE 0.9 % IV SOLN
INTRAVENOUS | Status: DC
  Administered 2016-04-27: 1000 mL via INTRAVENOUS
  Administered 2016-04-27: 13:00:00 via INTRAVENOUS
  Administered 2016-04-27: 125 mL/h via INTRAVENOUS
  Administered 2016-04-28: 1000 mL via INTRAVENOUS
  Administered 2016-04-29 (×2): via INTRAVENOUS

## 2016-04-27 MED ORDER — FENTANYL CITRATE (PF) 100 MCG/2ML IJ SOLN: 100.0000 ug | INTRAMUSCULAR | Status: DC | PRN

## 2016-04-27 MED ORDER — ZOLPIDEM TARTRATE 5 MG PO TABS: 5.0000 mg | ORAL_TABLET | Freq: Every evening | ORAL | Status: DC | PRN

## 2016-04-27 NOTE — Progress Notes (Addendum)
Subjective: Mid abdominal pain. He's been seen by IR it's not amenable to drainage at this point. We'll keep him nothing by mouth and continue IV antibiotics.  Objective: Vital signs in last 24 hours: Temp:  [98.7 F (37.1 C)-101.4 F (38.6 C)] 100 F (37.8 C) (08/08 0559) Pulse Rate:  [86-110] 86 (08/08 0559) Resp:  [16-18] 16 (08/08 0559) BP: (121-144)/(70-95) 131/71 (08/08 0559) SpO2:  [96 %-98 %] 98 % (08/08 0559) Weight:  [111.6 kg (246 lb)] 111.6 kg (246 lb) (08/08 0401)    NPO Febrile with temperatures up to 101.4, 100.8, 100 16.1  WBC   Intake/Output from previous day: No intake/output data recorded. Intake/Output this shift: No intake/output data recorded.  General appearance: alert, cooperative and no distress GI: Soft, point submitted abdomen is source of tenderness. Few bowel sounds. He does not appear to be extremely uncomfortable currently.  Lab Results:   Recent Labs  04/26/16 2149 04/27/16 0936  WBC 17.0* 16.1*  HGB 15.8 14.2  HCT 47.2 43.4  PLT 271 279    BMET  Recent Labs  04/26/16 2149  NA 132*  K 3.7  CL 95*  CO2 27  GLUCOSE 131*  BUN 13  CREATININE 1.18  CALCIUM 9.1   PT/INR No results for input(s): LABPROT, INR in the last 72 hours.   Recent Labs Lab 04/26/16 2149  AST 24  ALT 29  ALKPHOS 96  BILITOT 0.7  PROT 8.3*  ALBUMIN 3.8     Lipase  No results found for: LIPASE   Studies/Results: Dg Abdomen 1 View  Result Date: 04/26/2016 CLINICAL DATA:  Constipation.  Lower abdominal pain. EXAM: ABDOMEN - 1 VIEW COMPARISON:  None. FINDINGS: Upper abdomen not included in the field of view. Small volume of colonic stool. No small bowel dilatation to suggest obstruction. No abnormal calcifications. Included osseous structures are intact. IMPRESSION: Small stool burden.  No evidence of obstruction. Electronically Signed   By: Rubye OaksMelanie  Ehinger M.D.   On: 04/26/2016 21:18   Ct Abdomen Pelvis W Contrast  Result Date:  04/27/2016 CLINICAL DATA:  Acute onset of suprapubic pain and constipation. Fever. Initial encounter. EXAM: CT ABDOMEN AND PELVIS WITH CONTRAST TECHNIQUE: Multidetector CT imaging of the abdomen and pelvis was performed using the standard protocol following bolus administration of intravenous contrast. CONTRAST:  100mL ISOVUE-300 IOPAMIDOL (ISOVUE-300) INJECTION 61% COMPARISON:  Abdominal radiograph performed 04/26/2016 FINDINGS: The visualized lung bases are clear. The liver and spleen are unremarkable in appearance. The gallbladder is within normal limits. The pancreas and adrenal glands are unremarkable. A few small left renal cysts are noted. There is no evidence of hydronephrosis. No renal or ureteral stones are seen. No perinephric stranding is appreciated. No free fluid is identified. The small bowel is unremarkable in appearance. The stomach is within normal limits. No acute vascular abnormalities are seen. The appendix is normal in caliber, without evidence of appendicitis. There is a focal collection of fluid and inflammation posterior to the proximal sigmoid colon, with tiny foci of free air, tracking along the mesentery. The collection of fluid measures approximately 5.3 x 4.2 x 4.0 cm. No significant peripheral enhancement is yet seen, though this is suggestive of evolving abscess secondary to contained perforated diverticulitis. There is associated wall thickening at the proximal sigmoid colon. Free fluid and air remains contained at the left lower quadrant, along the mesentery. The bladder is relatively decompressed and grossly unremarkable. The prostate remains normal in size. No inguinal lymphadenopathy is seen. No acute osseous abnormalities  are identified. IMPRESSION: 1. Focal collection of fluid and inflammation posterior to the proximal sigmoid colon, with tiny foci of free air. The collection of fluid measures approximately 5.3 x 4.2 x 4.0 cm. No significant peripheral enhancement yet seen,  though this is suggestive of evolving abscess secondary to contained perforated diverticulitis. Associated wall thickening at the proximal sigmoid colon. The free fluid remains contained at the left lower quadrant at this time, along the mesentery. 2. Few small left renal cysts noted. Critical Value/emergent results were called by telephone at the time of interpretation on 04/27/2016 at 1:42 am to Dr. Cy Blamer, who verbally acknowledged these results. Electronically Signed   By: Roanna Raider M.D.   On: 04/27/2016 01:42   Prior to Admission medications   Medication Sig Start Date End Date Taking? Authorizing Provider  ibuprofen (ADVIL,MOTRIN) 200 MG tablet Take 600 mg by mouth every 6 (six) hours as needed for moderate pain.   Yes Historical Provider, MD    Medications: . [START ON 04/28/2016] enoxaparin (LOVENOX) injection  40 mg Subcutaneous Q24H  . piperacillin-tazobactam (ZOSYN)  IV  3.375 g Intravenous Q8H   . sodium chloride 125 mL/hr (04/27/16 0436)    Assessment/Plan Sigmoid diverticulitis with 5.3 cm abscess FEN:  NPO/IV fluids ID:  Day 1 Zosyn DVT:  Lovenox/SCD   Plan: Continue IV antibiotics and monitor;  labs in a.m.  LOS: 0 days    JENNINGS,WILLARD 04/27/2016 [phone]  Agree with above. Spoke with Dr. Odis Luster - abscess/inflammation not amendable to perc drain at this time. Does not look bad.  His girlfriend and brother are in the room.  They are eating [employer] burgers - not sure about that. Will let him have ice chips.  Ovidio Kin, MD, [facility] Surgery Pager: [phone] Office phone:  [phone]

## 2016-04-27 NOTE — Progress Notes (Signed)
Pain medication offered multiple times this shift.  Patient refused each time.

## 2016-04-27 NOTE — H&P (Signed)
[name] [name] is an 35 y.o. male.   Chief Complaint: Suprapubic/ LLQ abdominal pain HPI: This is a 35 yo male with no significant PMH/PSH who presents with a one week history of intermittent LLQ abdominal pain.  He describes this as pressure in the low left pelvis and behind his bladder.  The symptoms are relieved with a bowel movement, but he has had more difficulty with bowel movements over the last few days.  He denies any nausea, vomiting, urinary symptoms, or decreased appetite.  He was febrile when being evaluated at [facility].  He was found to have diverticulitis with perforation/ abscess so he was transferred to [facility] for admission.  History reviewed. No pertinent past medical history.  History reviewed. No pertinent surgical history.  No family history on file. Social History:  reports that he has been smoking Cigarettes.  He has been smoking about 1.00 pack per day. He has never used smokeless tobacco. He reports that he drinks alcohol. He reports that he does not use drugs.  Allergies: No Known Allergies  Medications Prior to Admission  Medication Sig Dispense Refill  . ibuprofen (ADVIL,MOTRIN) 200 MG tablet Take 600 mg by mouth every 6 (six) hours as needed for moderate pain.      Results for orders placed or performed during the hospital encounter of 04/26/16 (from the past 48 hour(s))  CBC with Differential     Status: Abnormal   Collection Time: 04/26/16  9:49 PM  Result Value Ref Range   WBC 17.0 (H) 4.0 - 10.5 K/uL   RBC 5.56 4.22 - 5.81 MIL/uL   Hemoglobin 15.8 13.0 - 17.0 g/dL   HCT 47.2 39.0 - 52.0 %   MCV 84.9 78.0 - 100.0 fL   MCH 28.4 26.0 - 34.0 pg   MCHC 33.5 30.0 - 36.0 g/dL   RDW 13.2 11.5 - 15.5 %   Platelets 271 150 - 400 K/uL   Neutrophils Relative % 70 %   Lymphocytes Relative 18 %   Monocytes Relative 6 %   Eosinophils Relative 0 %   Basophils Relative 0 %   Band Neutrophils 6 %   Neutro Abs 12.9 (H) 1.7 - 7.7 K/uL   Lymphs Abs 3.1 0.7 - 4.0 K/uL    Monocytes Absolute 1.0 0.1 - 1.0 K/uL   Eosinophils Absolute 0.0 0.0 - 0.7 K/uL   Basophils Absolute 0.0 0.0 - 0.1 K/uL   WBC Morphology ATYPICAL LYMPHOCYTES   Comprehensive metabolic panel     Status: Abnormal   Collection Time: 04/26/16  9:49 PM  Result Value Ref Range   Sodium 132 (L) 135 - 145 mmol/L   Potassium 3.7 3.5 - 5.1 mmol/L   Chloride 95 (L) 101 - 111 mmol/L   CO2 27 22 - 32 mmol/L   Glucose, Bld 131 (H) 65 - 99 mg/dL   BUN 13 6 - 20 mg/dL   Creatinine, Ser 1.18 0.61 - 1.24 mg/dL   Calcium 9.1 8.9 - 10.3 mg/dL   Total Protein 8.3 (H) 6.5 - 8.1 g/dL   Albumin 3.8 3.5 - 5.0 g/dL   AST 24 15 - 41 U/L   ALT 29 17 - 63 U/L   Alkaline Phosphatase 96 38 - 126 U/L   Total Bilirubin 0.7 0.3 - 1.2 mg/dL   GFR calc non Af Amer >60 >60 mL/min   GFR calc Af Amer >60 >60 mL/min    Comment: (NOTE) The eGFR has been calculated using the CKD EPI equation. This  calculation has not been validated in all clinical situations. eGFR's persistently <60 mL/min signify possible Chronic Kidney Disease.    Anion gap 10 5 - 15  Urinalysis, Routine w reflex microscopic (not at [facility])     Status: Abnormal   Collection Time: 04/26/16  9:50 PM  Result Value Ref Range   Color, Urine YELLOW YELLOW   APPearance CLEAR CLEAR   Specific Gravity, Urine 1.007 1.005 - 1.030   pH 6.0 5.0 - 8.0   Glucose, UA NEGATIVE NEGATIVE mg/dL   Hgb urine dipstick MODERATE (A) NEGATIVE   Bilirubin Urine NEGATIVE NEGATIVE   Ketones, ur NEGATIVE NEGATIVE mg/dL   Protein, ur NEGATIVE NEGATIVE mg/dL   Nitrite NEGATIVE NEGATIVE   Leukocytes, UA NEGATIVE NEGATIVE  Urine microscopic-add on     Status: Abnormal   Collection Time: 04/26/16  9:50 PM  Result Value Ref Range   Squamous Epithelial / LPF 0-5 (A) NONE SEEN   WBC, UA NONE SEEN 0 - 5 WBC/hpf   RBC / HPF 0-5 0 - 5 RBC/hpf   Bacteria, UA FEW (A) NONE SEEN   Dg Abdomen 1 View  Result Date: 04/26/2016 CLINICAL DATA:  Constipation.  Lower abdominal pain. EXAM:  ABDOMEN - 1 VIEW COMPARISON:  None. FINDINGS: Upper abdomen not included in the field of view. Small volume of colonic stool. No small bowel dilatation to suggest obstruction. No abnormal calcifications. Included osseous structures are intact. IMPRESSION: Small stool burden.  No evidence of obstruction. Electronically Signed   By: Rubye Oaks M.D.   On: 04/26/2016 21:18   Ct Abdomen Pelvis W Contrast  Result Date: 04/27/2016 CLINICAL DATA:  Acute onset of suprapubic pain and constipation. Fever. Initial encounter. EXAM: CT ABDOMEN AND PELVIS WITH CONTRAST TECHNIQUE: Multidetector CT imaging of the abdomen and pelvis was performed using the standard protocol following bolus administration of intravenous contrast. CONTRAST:  ISOVUE-300 IOPAMIDOL (ISOVUE-300) INJECTION 61% COMPARISON:  Abdominal radiograph performed 04/26/2016 FINDINGS: The visualized lung bases are clear. The liver and spleen are unremarkable in appearance. The gallbladder is within normal limits. The pancreas and adrenal glands are unremarkable. A few small left renal cysts are noted. There is no evidence of hydronephrosis. No renal or ureteral stones are seen. No perinephric stranding is appreciated. No free fluid is identified. The small bowel is unremarkable in appearance. The stomach is within normal limits. No acute vascular abnormalities are seen. The appendix is normal in caliber, without evidence of appendicitis. There is a focal collection of fluid and inflammation posterior to the proximal sigmoid colon, with tiny foci of free air, tracking along the mesentery. The collection of fluid measures approximately 5.3 x 4.2 x 4.0 cm. No significant peripheral enhancement is yet seen, though this is suggestive of evolving abscess secondary to contained perforated diverticulitis. There is associated wall thickening at the proximal sigmoid colon. Free fluid and air remains contained at the left lower quadrant, along the mesentery. The  bladder is relatively decompressed and grossly unremarkable. The prostate remains normal in size. No inguinal lymphadenopathy is seen. No acute osseous abnormalities are identified. IMPRESSION: 1. Focal collection of fluid and inflammation posterior to the proximal sigmoid colon, with tiny foci of free air. The collection of fluid measures approximately 5.3 x 4.2 x 4.0 cm. No significant peripheral enhancement yet seen, though this is suggestive of evolving abscess secondary to contained perforated diverticulitis. Associated wall thickening at the proximal sigmoid colon. The free fluid remains contained at the left lower quadrant at this time,  along the mesentery. 2. Few small left renal cysts noted. Critical Value/emergent results were called by telephone at the time of interpretation on 04/27/2016 at 1:42 am to Dr. Veatrice Kells, who verbally acknowledged these results. Electronically Signed   By: Garald Balding M.D.   On: 04/27/2016 01:42    Review of Systems  Constitutional: Negative for weight loss.  HENT: Negative for ear discharge, ear pain, hearing loss and tinnitus.   Eyes: Negative for blurred vision, double vision, photophobia and pain.  Respiratory: Negative for cough, sputum production and shortness of breath.   Cardiovascular: Negative for chest pain.  Gastrointestinal: Positive for abdominal pain and constipation. Negative for nausea and vomiting.  Genitourinary: Negative for dysuria, flank pain, frequency and urgency.  Musculoskeletal: Negative for back pain, falls, joint pain, myalgias and neck pain.  Neurological: Negative for dizziness, tingling, sensory change, focal weakness, loss of consciousness and headaches.  Endo/Heme/Allergies: Does not bruise/bleed easily.  Psychiatric/Behavioral: Negative for depression, memory loss and substance abuse. The patient is not nervous/anxious.     Blood pressure 131/71, pulse 86, temperature 100 F (37.8 C), temperature source Oral, resp. rate  16, height _0  (1.854 m), weight 111.6 kg (246 lb), SpO2 98 %. Physical Exam  WDWN in NAD HEENT:  EOMI, sclera anicteric Neck:  No masses, no thyromegaly Lungs:  CTA bilaterally; normal respiratory effort CV:  Regular rate and rhythm; no murmurs Abd:  Obese, + bowel sounds; mildly distended; tender in LLQ and suprapubic region Ext:  Well-perfused; no edema Skin:  Warm, dry; no sign of jaundice  Assessment/Plan Sigmoid diverticulitis with apparent perforation and 5.3 cm abscess  Will ask IR to place percutaneous drainage catheter to decompress the abscess.  I explained to the patient that he may still need to have surgery, which might involve a temporary colostomy with subsequent colostomy reversal.  He seems to understand my explanation and is agreeable with proceeding with percutaneous drainage catheter.  Maia Petties., MD 04/27/2016, 6:37 AM

## 2016-04-28 LAB — BASIC METABOLIC PANEL
ANION GAP: 6 (ref 5–15)
BUN: 18 mg/dL (ref 6–20)
CALCIUM: 8.7 mg/dL — AB (ref 8.9–10.3)
CO2: 28 mmol/L (ref 22–32)
Chloride: 104 mmol/L (ref 101–111)
Creatinine, Ser: 1.23 mg/dL (ref 0.61–1.24)
GFR calc Af Amer: >60 mL/min (ref >60)
GLUCOSE: 85 mg/dL (ref 65–99)
Potassium: 3.8 mmol/L (ref 3.5–5.1)
Sodium: 138 mmol/L (ref 135–145)

## 2016-04-28 LAB — CBC
HCT: 41.4 % (ref 39.0–52.0)
HEMOGLOBIN: 13.2 g/dL (ref 13.0–17.0)
MCH: 27.9 pg (ref 26.0–34.0)
MCHC: 31.9 g/dL (ref 30.0–36.0)
MCV: 87.5 fL (ref 78.0–100.0)
Platelets: 298 10*3/uL (ref 150–400)
RBC: 4.73 MIL/uL (ref 4.22–5.81)
RDW: 13.5 % (ref 11.5–15.5)
WBC: 11.3 10*3/uL — ABNORMAL HIGH (ref 4.0–10.5)

## 2016-04-28 MED ORDER — SACCHAROMYCES BOULARDII 250 MG PO CAPS
250.0000 mg | ORAL_CAPSULE | Freq: Two times a day (BID) | ORAL | Status: DC
  Administered 2016-04-28 – 2016-04-30 (×5): 250 mg via ORAL
  Filled 2016-04-28 (×5): qty 1

## 2016-04-28 NOTE — Progress Notes (Signed)
Subjective: He looks great, no pain.  He know something is there,but not having pain.  No fever and over all much improved.    Objective: Vital signs in last 24 hours: Temp:  [98.9 F (37.2 C)-99.7 F (37.6 C)] 98.9 F (37.2 C) (08/09 0558) Pulse Rate:  [81-83] 81 (08/09 0558) Resp:  [16-18] 18 (08/09 0558) BP: (121-131)/(63-81) 129/70 (08/09 0558) SpO2:  [95 %-99 %] 99 % (08/09 0558) Last BM Date: 04/27/16  Intake/Output from previous day: 08/08 0701 - 08/09 0700 In: 1311 [P.O.:120; I.V.:1191] Out: -  Intake/Output this shift: No intake/output data recorded.  General appearance: alert, cooperative and no distress Resp: clear to auscultation bilaterally GI: soft, nontender, + BS, having allot of loose stool.  Lab Results:   Recent Labs  04/27/16 0936 04/28/16 0453  WBC 16.1* 11.3*  HGB 14.2 13.2  HCT 43.4 41.4  PLT 279 298    BMET  Recent Labs  04/26/16 2149 04/27/16 0936 04/28/16 0453  NA 132*  --  138  K 3.7  --  3.8  CL 95*  --  104  CO2 27  --  28  GLUCOSE 131*  --  85  BUN 13  --  18  CREATININE 1.18 1.13 1.23  CALCIUM 9.1  --  8.7*   PT/INR No results for input(s): LABPROT, INR in the last 72 hours.   Recent Labs Lab 04/26/16 2149  AST 24  ALT 29  ALKPHOS 96  BILITOT 0.7  PROT 8.3*  ALBUMIN 3.8     Lipase  No results found for: LIPASE   Studies/Results: Dg Abdomen 1 View  Result Date: 04/26/2016 CLINICAL DATA:  Constipation.  Lower abdominal pain. EXAM: ABDOMEN - 1 VIEW COMPARISON:  None. FINDINGS: Upper abdomen not included in the field of view. Small volume of colonic stool. No small bowel dilatation to suggest obstruction. No abnormal calcifications. Included osseous structures are intact. IMPRESSION: Small stool burden.  No evidence of obstruction. Electronically Signed   By: Rubye OaksMelanie  Ehinger M.D.   On: 04/26/2016 21:18   Ct Abdomen Pelvis W Contrast  Result Date: 04/27/2016 CLINICAL DATA:  Acute onset of suprapubic pain and  constipation. Fever. Initial encounter. EXAM: CT ABDOMEN AND PELVIS WITH CONTRAST TECHNIQUE: Multidetector CT imaging of the abdomen and pelvis was performed using the standard protocol following bolus administration of intravenous contrast. CONTRAST:  100mL ISOVUE-300 IOPAMIDOL (ISOVUE-300) INJECTION 61% COMPARISON:  Abdominal radiograph performed 04/26/2016 FINDINGS: The visualized lung bases are clear. The liver and spleen are unremarkable in appearance. The gallbladder is within normal limits. The pancreas and adrenal glands are unremarkable. A few small left renal cysts are noted. There is no evidence of hydronephrosis. No renal or ureteral stones are seen. No perinephric stranding is appreciated. No free fluid is identified. The small bowel is unremarkable in appearance. The stomach is within normal limits. No acute vascular abnormalities are seen. The appendix is normal in caliber, without evidence of appendicitis. There is a focal collection of fluid and inflammation posterior to the proximal sigmoid colon, with tiny foci of free air, tracking along the mesentery. The collection of fluid measures approximately 5.3 x 4.2 x 4.0 cm. No significant peripheral enhancement is yet seen, though this is suggestive of evolving abscess secondary to contained perforated diverticulitis. There is associated wall thickening at the proximal sigmoid colon. Free fluid and air remains contained at the left lower quadrant, along the mesentery. The bladder is relatively decompressed and grossly unremarkable. The prostate remains normal in  size. No inguinal lymphadenopathy is seen. No acute osseous abnormalities are identified. IMPRESSION: 1. Focal collection of fluid and inflammation posterior to the proximal sigmoid colon, with tiny foci of free air. The collection of fluid measures approximately 5.3 x 4.2 x 4.0 cm. No significant peripheral enhancement yet seen, though this is suggestive of evolving abscess secondary to  contained perforated diverticulitis. Associated wall thickening at the proximal sigmoid colon. The free fluid remains contained at the left lower quadrant at this time, along the mesentery. 2. Few small left renal cysts noted. Critical Value/emergent results were called by telephone at the time of interpretation on 04/27/2016 at 1:42 am to Dr. Cy Blamer, who verbally acknowledged these results. Electronically Signed   By: Roanna Raider M.D.   On: 04/27/2016 01:42    Medications: . enoxaparin (LOVENOX) injection  40 mg Subcutaneous Q24H  . piperacillin-tazobactam (ZOSYN)  IV  3.375 g Intravenous Q8H    Assessment/Plan Sigmoid diverticulitis with 5.3 cm abscess FEN:  NPO/IV fluids  -  Start clears this AM ID:  Day  Zosyn DVT:  Lovenox/SCD     LOS: 1 day   JENNINGS,WILLARD 04/28/[id number]  Agree with above. He looks very good. Possibly home tomorrow.  Brother and girlfriend in room.  Ovidio Kin, MD, [facility] Surgery Pager: [phone] Office phone:  [phone]

## 2016-04-29 DIAGNOSIS — K5700 Diverticulitis of small intestine with perforation and abscess without bleeding: Secondary | ICD-10-CM

## 2016-04-29 LAB — CBC
HEMATOCRIT: 38.5 % — AB (ref 39.0–52.0)
HEMOGLOBIN: 12.3 g/dL — AB (ref 13.0–17.0)
MCH: 27.6 pg (ref 26.0–34.0)
MCHC: 31.9 g/dL (ref 30.0–36.0)
MCV: 86.5 fL (ref 78.0–100.0)
Platelets: 291 10*3/uL (ref 150–400)
RBC: 4.45 MIL/uL (ref 4.22–5.81)
RDW: 13.3 % (ref 11.5–15.5)
WBC: 7.5 10*3/uL (ref 4.0–10.5)

## 2016-04-29 LAB — HEMOGLOBIN A1C
Hgb A1c MFr Bld: 7.2 % — ABNORMAL HIGH (ref 4.8–5.6)
MEAN PLASMA GLUCOSE: 160 mg/dL

## 2016-04-29 LAB — GLUCOSE, CAPILLARY
GLUCOSE-CAPILLARY: 82 mg/dL (ref 65–99)
GLUCOSE-CAPILLARY: 121 mg/dL — AB (ref 65–99)
Glucose-Capillary: 118 mg/dL — ABNORMAL HIGH (ref 65–99)

## 2016-04-29 MED ORDER — INSULIN ASPART 100 UNIT/ML ~~LOC~~ SOLN
0.0000 [IU] | Freq: Three times a day (TID) | SUBCUTANEOUS | Status: DC
  Administered 2016-04-29: 1 [IU] via SUBCUTANEOUS

## 2016-04-29 MED ORDER — LIVING WELL WITH DIABETES BOOK
Freq: Once | Status: AC
  Administered 2016-04-29: 15:00:00
  Filled 2016-04-29: qty 1

## 2016-04-29 NOTE — Discharge Instructions (Signed)
Diverticulitis Diverticulitis is when small pockets that have formed in your colon (large intestine) become infected or swollen. HOME CARE  Follow your doctor's instructions.  Follow a special diet if told by your doctor.  When you feel better, your doctor may tell you to change your diet. You may be told to eat a lot of fiber. Fruits and vegetables are good sources of fiber. Fiber makes it easier to poop (have bowel movements).  Take supplements or probiotics as told by your doctor.  Only take medicines as told by your doctor.  Keep all follow-up visits with your doctor. GET HELP IF:  Your pain does not get better.  You have a hard time eating food.  You are not pooping like normal. GET HELP RIGHT AWAY IF:  Your pain gets worse.  Your problems do not get better.  Your problems suddenly get worse.  You have a fever.  You keep throwing up (vomiting).  You have bloody or black, tarry poop (stool). MAKE SURE YOU:   Understand these instructions.  Will watch your condition.  Will get help right away if you are not doing well or get worse.   This information is not intended to replace advice given to you by your health care provider. Make sure you discuss any questions you have with your health care provider.   Document Released: 02/23/2008 Document Revised: 09/11/2013 Document Reviewed: 08/01/2013 Elsevier Interactive Patient Education 2016 [employer] Inc.  Basic Carbohydrate Counting for Diabetes Mellitus Carbohydrate counting is a method for keeping track of the amount of carbohydrates you eat. Eating carbohydrates naturally increases the level of sugar (glucose) in your blood, so it is important for you to know the amount that is okay for you to have in every meal. Carbohydrate counting helps keep the level of glucose in your blood within normal limits. The amount of carbohydrates allowed is different for every person. A dietitian can help you calculate the amount that  is right for you. Once you know the amount of carbohydrates you can have, you can count the carbohydrates in the foods you want to eat. Carbohydrates are found in the following foods:  Grains, such as breads and cereals.  Dried beans and soy products.  Starchy vegetables, such as potatoes, peas, and corn.  Fruit and fruit juices.  Milk and yogurt.  Sweets and snack foods, such as cake, cookies, candy, chips, soft drinks, and fruit drinks. CARBOHYDRATE COUNTING There are two ways to count the carbohydrates in your food. You can use either of the methods or a combination of both. Reading the "Nutrition Facts" on Packaged Food The "Nutrition Facts" is an area that is included on the labels of almost all packaged food and beverages in the [place]. It includes the serving size of that food or beverage and information about the nutrients in each serving of the food, including the grams (g) of carbohydrate per serving.  Decide the number of servings of this food or beverage that you will be able to eat or drink. Multiply that number of servings by the number of grams of carbohydrate that is listed on the label for that serving. The total will be the amount of carbohydrates you will be having when you eat or drink this food or beverage. Learning Standard Serving Sizes of Food When you eat food that is not packaged or does not include "Nutrition Facts" on the label, you need to measure the servings in order to count the amount of carbohydrates.A serving  of most carbohydrate-rich foods contains about 15 g of carbohydrates. The following list includes serving sizes of carbohydrate-rich foods that provide 15 g ofcarbohydrate per serving:   1 slice of bread (1 oz) or 1 six-inch tortilla.    of a hamburger bun or English muffin.  4-6 crackers.   cup unsweetened dry cereal.    cup hot cereal.   cup rice or pasta.    cup mashed potatoes or  of a large baked potato.  1 cup fresh  fruit or one small piece of fruit.    cup canned or frozen fruit or fruit juice.  1 cup milk.   cup plain fat-free yogurt or yogurt sweetened with artificial sweeteners.   cup cooked dried beans or starchy vegetable, such as peas, corn, or potatoes.  Decide the number of standard-size servings that you will eat. Multiply that number of servings by 15 (the grams of carbohydrates in that serving). For example, if you eat 2 cups of strawberries, you will have eaten 2 servings and 30 g of carbohydrates (2 servings x 15 g = 30 g). For foods such as soups and casseroles, in which more than one food is mixed in, you will need to count the carbohydrates in each food that is included. EXAMPLE OF CARBOHYDRATE COUNTING Sample Dinner  3 oz chicken breast.   cup of brown rice.   cup of corn.  1 cup milk.   1 cup strawberries with sugar-free whipped topping.  Carbohydrate Calculation Step 1: Identify the foods that contain carbohydrates:   Rice.   Corn.   Milk.   Strawberries. Step 2:Calculate the number of servings eaten of each:   2 servings of rice.   1 serving of corn.   1 serving of milk.   1 serving of strawberries. Step 3: Multiply each of those number of servings by 15 g:   2 servings of rice x 15 g = 30 g.   1 serving of corn x 15 g = 15 g.   1 serving of milk x 15 g = 15 g.   1 serving of strawberries x 15 g = 15 g. Step 4: Add together all of the amounts to find the total grams of carbohydrates eaten: 30 g + 15 g + 15 g + 15 g = 75 g.   This information is not intended to replace advice given to you by your health care provider. Make sure you discuss any questions you have with your health care provider.   Document Released: 09/06/2005 Document Revised: 09/27/2014 Document Reviewed: 08/03/2013 Elsevier Interactive Patient Education 2016 Elsevier Inc.  Blood Glucose Monitoring, Adult Monitoring your blood glucose (also know as blood sugar)  helps you to manage your diabetes. It also helps you and your health care provider monitor your diabetes and determine how well your treatment plan is working. WHY SHOULD YOU MONITOR YOUR BLOOD GLUCOSE?  It can help you understand how food, exercise, and medicine affect your blood glucose.  It allows you to know what your blood glucose is at any given moment. You can quickly tell if you are having low blood glucose (hypoglycemia) or high blood glucose (hyperglycemia).  It can help you and your health care provider know how to adjust your medicines.  It can help you understand how to manage an illness or adjust medicine for exercise. WHEN SHOULD YOU TEST? Your health care provider will help you decide how often you should check your blood glucose. This may depend on  the type of diabetes you have, your diabetes control, or the types of medicines you are taking. Be sure to write down all of your blood glucose readings so that this information can be reviewed with your health care provider. See below for examples of testing times that your health care provider may suggest. Type 1 Diabetes  Test at least 2 times per day if your diabetes is well controlled, if you are using an insulin pump, or if you perform multiple daily injections.  If your diabetes is not well controlled or if you are sick, you may need to test more often.  It is a good idea to also test:  Before every insulin injection.  Before and after exercise.  Between meals and 2 hours after a meal.  Occasionally between 2:00 a.m. and 3:00 a.m. Type 2 Diabetes  If you are taking insulin, test at least 2 times per day. However, it is best to test before every insulin injection.  If you take medicines by mouth (orally), test 2 times a day.  If you are on a controlled diet, test once a day.  If your diabetes is not well controlled or if you are sick, you may need to monitor more often. HOW TO MONITOR YOUR BLOOD GLUCOSE Supplies  Needed  Blood glucose meter.  Test strips for your meter. Each meter has its own strips. You must use the strips that go with your own meter.  A pricking needle (lancet).  A device that holds the lancet (lancing device).  A journal or log book to write down your results. Procedure  Wash your hands with soap and water. Alcohol is not preferred.  Prick the side of your finger (not the tip) with the lancet.  Gently milk the finger until a small drop of blood appears.  Follow the instructions that come with your meter for inserting the test strip, applying blood to the strip, and using your blood glucose meter. Other Areas to Get Blood for Testing Some meters allow you to use other areas of your body (other than your finger) to test your blood. These areas are called alternative sites. The most common alternative sites are:  The forearm.  The thigh.  The back area of the lower leg.  The palm of the hand. The blood flow in these areas is slower. Therefore, the blood glucose values you get may be delayed, and the numbers are different from what you would get from your fingers. Do not use alternative sites if you think you are having hypoglycemia. Your reading will not be accurate. Always use a finger if you are having hypoglycemia. Also, if you cannot feel your lows (hypoglycemia unawareness), always use your fingers for your blood glucose checks. ADDITIONAL TIPS FOR GLUCOSE MONITORING  Do not reuse lancets.  Always carry your supplies with you.  All blood glucose meters have a 24-hour "hotline" number to call if you have questions or need help.  Adjust (calibrate) your blood glucose meter with a control solution after finishing a few boxes of strips. BLOOD GLUCOSE RECORD KEEPING It is a good idea to keep a daily record or log of your blood glucose readings. Most glucose meters, if not all, keep your glucose records stored in the meter. Some meters come with the ability to download  your records to your home computer. Keeping a record of your blood glucose readings is especially helpful if you are wanting to look for patterns. Make notes to go along with the  blood glucose readings because you might forget what happened at that exact time. Keeping good records helps you and your health care provider to work together to achieve good diabetes management.    This information is not intended to replace advice given to you by your health care provider. Make sure you discuss any questions you have with your health care provider.   Document Released: 09/09/2003 Document Revised: 09/27/2014 Document Reviewed: 01/29/2013 Elsevier Interactive Patient Education 2016 [employer]  Smoking Cessation, Tips for Success If you are ready to quit smoking, congratulations! You have chosen to help yourself be healthier. Cigarettes bring nicotine, tar, carbon monoxide, and other irritants into your body. Your lungs, heart, and blood vessels will be able to work better without these poisons. There are many different ways to quit smoking. Nicotine gum, nicotine patches, a nicotine inhaler, or nicotine nasal spray can help with physical craving. Hypnosis, support groups, and medicines help break the habit of smoking. WHAT THINGS CAN I DO TO MAKE QUITTING EASIER?  Here are some tips to help you quit for good:  Pick a date when you will quit smoking completely. Tell all of your friends and family about your plan to quit on that date.  Do not try to slowly cut down on the number of cigarettes you are smoking. Pick a quit date and quit smoking completely starting on that day.  Throw away all cigarettes.   Clean and remove all ashtrays from your home, work, and car.  On a card, write down your reasons for quitting. Carry the card with you and read it when you get the urge to smoke.  Cleanse your body of nicotine. Drink enough water and fluids to keep your urine clear or pale yellow. Do this after  quitting to flush the nicotine from your body.  Learn to predict your moods. Do not let a bad situation be your excuse to have a cigarette. Some situations in your life might tempt you into wanting a cigarette.  Never have "just one" cigarette. It leads to wanting another and another. Remind yourself of your decision to quit.  Change habits associated with smoking. If you smoked while driving or when feeling stressed, try other activities to replace smoking. Stand up when drinking your coffee. Brush your teeth after eating. Sit in a different chair when you read the paper. Avoid alcohol while trying to quit, and try to drink fewer caffeinated beverages. Alcohol and caffeine may urge you to smoke.  Avoid foods and drinks that can trigger a desire to smoke, such as sugary or spicy foods and alcohol.  Ask people who smoke not to smoke around you.  Have something planned to do right after eating or having a cup of coffee. For example, plan to take a walk or exercise.  Try a relaxation exercise to calm you down and decrease your stress. Remember, you may be tense and nervous for the first 2 weeks after you quit, but this will pass.  Find new activities to keep your hands busy. Play with a pen, coin, or rubber band. Doodle or draw things on paper.  Brush your teeth right after eating. This will help cut down on the craving for the taste of tobacco after meals. You can also try mouthwash.   Use oral substitutes in place of cigarettes. Try using lemon drops, carrots, cinnamon sticks, or chewing gum. Keep them handy so they are available when you have the urge to smoke.  When you have the  urge to smoke, try deep breathing.  Designate your home as a nonsmoking area.  If you are a heavy smoker, ask your health care provider about a prescription for nicotine chewing gum. It can ease your withdrawal from nicotine.  Reward yourself. Set aside the cigarette money you save and buy yourself something  nice.  Look for support from others. Join a support group or smoking cessation program. Ask someone at home or at work to help you with your plan to quit smoking.  Always ask yourself, "Do I need this cigarette or is this just a reflex?" Tell yourself, "Today, I choose not to smoke," or "I do not want to smoke." You are reminding yourself of your decision to quit.  Do not replace cigarette smoking with electronic cigarettes (commonly called e-cigarettes). The safety of e-cigarettes is unknown, and some may contain harmful chemicals.  If you relapse, do not give up! Plan ahead and think about what you will do the next time you get the urge to smoke. HOW WILL I FEEL WHEN I QUIT SMOKING? You may have symptoms of withdrawal because your body is used to nicotine (the addictive substance in cigarettes). You may crave cigarettes, be irritable, feel very hungry, cough often, get headaches, or have difficulty concentrating. The withdrawal symptoms are only temporary. They are strongest when you first quit but will go away within 10-14 days. When withdrawal symptoms occur, stay in control. Think about your reasons for quitting. Remind yourself that these are signs that your body is healing and getting used to being without cigarettes. Remember that withdrawal symptoms are easier to treat than the major diseases that smoking can cause.  Even after the withdrawal is over, expect periodic urges to smoke. However, these cravings are generally short lived and will go away whether you smoke or not. Do not smoke! WHAT RESOURCES ARE AVAILABLE TO HELP ME QUIT SMOKING? Your health care provider can direct you to community resources or hospitals for support, which may include:  Group support.  Education.  Hypnosis.  Therapy.   This information is not intended to replace advice given to you by your health care provider. Make sure you discuss any questions you have with your health care provider.   Document  Released: 06/04/2004 Document Revised: 09/27/2014 Document Reviewed: 02/22/2013 Elsevier Interactive Patient Education [employer]

## 2016-04-29 NOTE — Progress Notes (Addendum)
Inpatient Diabetes Program Recommendations  AACE/ADA: New Consensus Statement on Inpatient Glycemic Control (2015)  Target Ranges:  Prepandial:   less than 140 mg/dL      Peak postprandial:   less than 180 mg/dL (1-2 hours)      Critically ill patients:  140 - 180 mg/dL   Lab Results  Component Value Date   HGBA1C 7.2 (H) 04/28/2016    Review of Glycemic Control  Diabetes history: Newly-diagnosed DM Outpatient Diabetes medications: None Current orders for Inpatient glycemic control: Novolog sensitive tidwc  HgbA1C of 7.2% indicates diagnosis of DM. Pt has family hx DM. Will need to get PCP to manage his diabetes. Has used ED for past 4 years for any medical issues. Ordered Living Well With Diabetes book and diabetes and diabetes videos to view on Patient Education [employer]. Will talk to pt in the next hour or two regarding his new diagnosis. Will also order OP Diabetes Education consult for newly-diagnosed DM.   Will need prescription for meter, strips and lancets at discharge. Will instruct pt to take logbook to MD appt for any needed adjustments to his diabetes medications.  Thank you. Ailene Ardshonda Norelle Runnion, RD, LDN, CDE Inpatient Diabetes Coordinator [phone]  Spoke at length with pt and family regarding new diagnosis of DM. Pt states he is very motivated to lose a little bit of weight and start exercising again. Will call to get appt with Dad's PCP in the next week or two. Discussed importance of smoking cessation and blood glucose monitoring. Instructed pt to take logbook to MD for any needed adjustments in medication. Discussed going home on metformin.  Pt very motivated to make lifestyle changes to hopefully control his blood sugars. Instructed pt to write down any questions he might have after viewing diabetes videos and reading Living Well book. Discussed with RN.

## 2016-04-29 NOTE — Progress Notes (Signed)
  Subjective: He looks good, no pain tolerating clears well.  His brother has Diabetes and A1C elevated.  I talked to him about his and have called for Medical management of this issue.  He is doing fine from his diverticulitis  Objective: Vital signs in last 24 hours: Temp:  [98 F (36.7 C)-98.5 F (36.9 C)] 98.2 F (36.8 C) (08/10 0626) Pulse Rate:  [60-67] 64 (08/10 0626) Resp:  [18] 18 (08/10 0626) BP: (119-142)/(78-86) 119/86 (08/10 0626) SpO2:  [97 %-100 %] 100 % (08/10 0626) Last BM Date: 04/28/16 PO not recorded Voided x 6 Afebrile vital signs are stable WBC remains stable. Hemoglobin A1c is 7.2.  Intake/Output from previous day: 08/09 0701 - 08/10 0700 In: 2950 [I.V.:2850; IV Piggyback:100] Out: -  Intake/Output this shift: No intake/output data recorded.  General appearance: alert, cooperative and no distress Resp: clear to auscultation bilaterally GI: soft, non-tender; bowel sounds normal; no masses,  no organomegaly  Lab Results:   Recent Labs  04/28/16 0453 04/29/16 0431  WBC 11.3* 7.5  HGB 13.2 12.3*  HCT 41.4 38.5*  PLT 298 291    BMET  Recent Labs  04/26/16 2149 04/27/16 0936 04/28/16 0453  NA 132*  --  138  K 3.7  --  3.8  CL 95*  --  104  CO2 27  --  28  GLUCOSE 131*  --  85  BUN 13  --  18  CREATININE 1.18 1.13 1.23  CALCIUM 9.1  --  8.7*   PT/INR No results for input(s): LABPROT, INR in the last 72 hours.   Recent Labs Lab 04/26/16 2149  AST 24  ALT 29  ALKPHOS 96  BILITOT 0.7  PROT 8.3*  ALBUMIN 3.8     Lipase  No results found for: LIPASE   Studies/Results: No results found.  Medications: . enoxaparin (LOVENOX) injection  40 mg Subcutaneous Q24H  . piperacillin-tazobactam (ZOSYN)  IV  3.375 g Intravenous Q8H  . saccharomyces boulardii  250 mg Oral BID    Assessment/Plan Sigmoid diverticulitis with 5.3 cm abscess FEN: Clears/IV fluids  -  Start full this AM Hemoglobin A1c 7.2. ID: Day  4 Zosyn DVT:  Lovenox/SCD   Plan: We've advanced him to full liquids, continue current IV antibiotics. If he continues to do well we'll put him on a carb modified diet tomorrow, and plan to send him home on oral antibiotics for 10 more days.   I've ask medicine to see and will begin diabetic teaching and treatment.    LOS: 2 days    JENNINGS,WILLARD 04/29/2016 [phone]  Agree with above. Will need follow up for his DM.  Ovidio Kinavid Whitney Hillegass, MD, [facility] Pager: [phone] Office phone:  [phone]

## 2016-04-29 NOTE — Progress Notes (Addendum)
Consulted over the phone for a 35 year old male admitted for diverticulitis by general surgery who has family history of type 2 diabetes mellitus, and A1c was ordered which came back at 7.2. Sugars in the hospital have been in reasonable control.  Will recommend diabetic education which has been ordered.  Insulin sliding scale while in the hospital  Once medically stable and on a regular oral diet place him on 500 mg of Glucophage oral twice a day, discharge home, follow with PCP for further glycemic control.

## 2016-04-30 ENCOUNTER — Encounter (HOSPITAL_COMMUNITY): Payer: Self-pay | Admitting: General Surgery

## 2016-04-30 DIAGNOSIS — E119 Type 2 diabetes mellitus without complications: Secondary | ICD-10-CM

## 2016-04-30 HISTORY — DX: Type 2 diabetes mellitus without complications: E11.9

## 2016-04-30 LAB — GLUCOSE, CAPILLARY
GLUCOSE-CAPILLARY: 101 mg/dL — AB (ref 65–99)
GLUCOSE-CAPILLARY: 79 mg/dL (ref 65–99)

## 2016-04-30 MED ORDER — BLOOD GLUCOSE MONITOR KIT: PACK | 0 refills | Status: DC

## 2016-04-30 MED ORDER — METFORMIN HCL 500 MG PO TABS: 500.0000 mg | ORAL_TABLET | Freq: Two times a day (BID) | ORAL | Status: DC

## 2016-04-30 MED ORDER — SACCHAROMYCES BOULARDII 250 MG PO CAPS: ORAL_CAPSULE | ORAL | Status: AC

## 2016-04-30 MED ORDER — AMOXICILLIN-POT CLAVULANATE 875-125 MG PO TABS
1.0000 | ORAL_TABLET | Freq: Two times a day (BID) | ORAL | Status: DC
  Administered 2016-04-30: 1 via ORAL
  Filled 2016-04-30: qty 1

## 2016-04-30 MED ORDER — AMOXICILLIN-POT CLAVULANATE 875-125 MG PO TABS: 1.0000 | ORAL_TABLET | Freq: Two times a day (BID) | ORAL | 0 refills | Status: AC

## 2016-04-30 MED ORDER — BLOOD GLUCOSE MONITOR KIT: PACK | 0 refills | Status: AC

## 2016-04-30 MED ORDER — METFORMIN HCL 500 MG PO TABS: 500.0000 mg | ORAL_TABLET | Freq: Two times a day (BID) | ORAL | 0 refills | Status: AC

## 2016-04-30 MED ORDER — ACETAMINOPHEN 325 MG PO TABS: 650.0000 mg | ORAL_TABLET | Freq: Four times a day (QID) | ORAL | Status: AC | PRN

## 2016-04-30 NOTE — Progress Notes (Signed)
Pt discharged home with family. Discharge instructions given and all questions answered.  

## 2016-04-30 NOTE — Progress Notes (Addendum)
Inpatient Diabetes Program Recommendations  AACE/ADA: New Consensus Statement on Inpatient Glycemic Control (2015)  Target Ranges:  Prepandial:   less than 140 mg/dL      Peak postprandial:   less than 180 mg/dL (1-2 hours)      Critically ill patients:  140 - 180 mg/dL   Lab Results  Component Value Date   GLUCAP 101 (H) 04/30/2016   HGBA1C 7.2 (H) 04/28/2016    Review of Glycemic Control  Results for [name] [name], [name] [name] (MRN [id number]) as of 04/30/2016 11:21  Ref. Range 04/29/2016 13:33 04/29/2016 18:13 04/29/2016 21:56 04/30/2016 07:46  Glucose-Capillary Latest Ref Range: 65 - 99 mg/dL 82 621121 (H) 308118 (H) 657101 (H)    Spoke with pt this am regarding his new diagnosis of DM. Pt happy that blood sugars have improved. Very motivated to make lifestyle changes. Father will be purchasing meter and supplies. Pt states he will call father's PCP to make appt for his diabetes management. Will order OP Diabetes Education when DM diagnosis is listed. Good outcome expected.  Thank you. Ailene Ardshonda Wilma Wuthrich, RD, LDN, CDE Inpatient Diabetes Coordinator [phone]

## 2016-04-30 NOTE — Progress Notes (Signed)
  Subjective: He looks great, no pain. Glucoses are well controlled we are going to advance his diet. Start him on Augmentin and plan discharge later this afternoon. I've asked the dietitian come up and talk to him about diet also.  Objective: Vital signs in last 24 hours: Temp:  [97.9 F (36.6 C)-98.4 F (36.9 C)] 97.9 F (36.6 C) (08/11 0528) Pulse Rate:  [51-64] 55 (08/11 0528) Resp:  [18] 18 (08/11 0528) BP: (123-142)/(67-73) 141/73 (08/11 0528) SpO2:  [97 %-100 %] 97 % (08/11 0528) Last BM Date: 04/29/16 Afebrile, VSS Normal WBC yesterday  glucoses are well controlled Going to car modified diet Intake/Output from previous day: 08/10 0701 - 08/11 0700 In: 240 [P.O.:240] Out: -  Intake/Output this shift: Total I/O In: 240 [P.O.:240] Out: -   General appearance: alert, cooperative and no distress Resp: clear to auscultation bilaterally GI: soft, non-tender; bowel sounds normal; no masses,  no organomegaly  Lab Results:   Recent Labs  04/28/16 0453 04/29/16 0431  WBC 11.3* 7.5  HGB 13.2 12.3*  HCT 41.4 38.5*  PLT 298 291    BMET  Recent Labs  04/28/16 0453  NA 138  K 3.8  CL 104  CO2 28  GLUCOSE 85  BUN 18  CREATININE 1.23  CALCIUM 8.7*   PT/INR No results for input(s): LABPROT, INR in the last 72 hours.   Recent Labs Lab 04/26/16 2149  AST 24  ALT 29  ALKPHOS 96  BILITOT 0.7  PROT 8.3*  ALBUMIN 3.8     Lipase  No results found for: LIPASE   Studies/Results: No results found.  Medications: . enoxaparin (LOVENOX) injection  40 mg Subcutaneous Q24H  . insulin aspart  0-9 Units Subcutaneous TID WC  . piperacillin-tazobactam (ZOSYN)  IV  3.375 g Intravenous Q8H  . saccharomyces boulardii  250 mg Oral BID   . sodium chloride 75 mL/hr at 04/29/16 1621    Assessment/Plan Sigmoid diverticulitis with 5.3 cm abscess FEN: full liquids /IV fluids -  New diabetes type II    Hemoglobin A1c 7.2. ID: Day 5 Zosyn DVT:  Lovenox/SCD  Plan: Home later today with 10 more days of oral Augmentin and probiotic. We are starting him on Glucophage and a car modified diet today also.   He will follow-up with his PCP, and Dr. Ezzard [name]  on 05/14/16   I've asked the office to set him up to get a CBC and a CMP prior to seeing Dr. Ezzard [name] also.  LOS: 3 days    [name],[name] 04/30/2016 [id number]  Agree with above.  Ovidio Kinavid Bernardina Cacho, MD, [facility] Pager: [phone] Office phone:  [phone]

## 2016-04-30 NOTE — Plan of Care (Signed)
Problem: Food- and Nutrition-Related Knowledge Deficit (NB-1.1) Goal: Nutrition education Formal process to instruct or train a patient/client in a skill or to impart knowledge to help patients/clients voluntarily manage or modify food choices and eating behavior to maintain or improve health. Outcome: Completed/Met Date Met: 04/30/16  RD consulted for nutrition education regarding diabetes.   Lab Results  Component Value Date   HGBA1C 7.2 (H) 04/28/2016    RD provided "Carbohydrate Counting for People with Diabetes" handout from the Academy of Nutrition and Dietetics. Discussed different food groups and their effects on blood sugar, emphasizing carbohydrate-containing foods. Provided list of carbohydrates and recommended serving sizes of common foods.  Discussed importance of controlled and consistent carbohydrate intake throughout the day. Provided examples of ways to balance meals/snacks and encouraged intake of high-fiber, whole grain complex carbohydrates. Teach back method used.  Expect fair compliance.  Body mass index is 32.46 kg/m. Pt meets criteria for obese based on current BMI.  Current diet order is carb modified, patient is consuming approximately 100% of meals at this time. Labs and medications reviewed. No further nutrition interventions warranted at this time. RD contact information provided. If additional nutrition issues arise, please re-consult RD.  [name] [name]. [name] Nault, MS, RD LDN Inpatient Clinical Dietitian Pager [phone]

## 2016-04-30 NOTE — Progress Notes (Signed)
Spoke with patient at bedside regarding CBG meter. He states he has spoken with his father and he has one he will use. He understands that he can purchase one at most pharmacies, he also has discussed with the diabetes coordinator and feels comfortable with his plan.

## 2016-05-05 NOTE — Discharge Summary (Signed)
Physician Discharge Summary  Patient ID: [name] [name] [name] MRN: [id number] DOB/AGE: May 28, 1981 35 y.o.  Admit date: 04/26/2016 Discharge date: 04/30/2016  Admission Diagnoses:   Sigmoid diverticulitis with 5.3 cm abscess Tobacco use Body mass index is 32.4  Discharge Diagnoses:  Sigmoid diverticulitis with 5.3 cm abscess New Type II AODM Tobacco use  BMI 32.4 Family Hx of Diabetes  Principal Problem:   New onset type 2 diabetes mellitus ([place]) Active Problems:   Diverticulitis   PROCEDURES: None  Hospital Course: This is a 35 yo male with no significant PMH/PSH who presents with a one week history of intermittent LLQ abdominal pain.  He describes this as pressure in the low left pelvis and behind his bladder.  The symptoms are relieved with a bowel movement, but he has had more difficulty with bowel movements over the last few days.  He denies any nausea, vomiting, urinary symptoms, or decreased appetite.  He was febrile when being evaluated at [facility].  He was found to have diverticulitis with perforation/ abscess so he was transferred to [facility] for admission. He was admitted and placed on IV Zosyn.  Glucose was up and A1C was checked.  It was 7.2.  He did well from both the diverticulitis and the diabetes.  We got Diabetes team and Hospitalist team to see him and worked on discharge treatment.  He is to follow up with his PCP.  He did well from his abdominal pain standpoint.  Symptoms and WBC improved on Zosyn.  He was discharged on 10 additional days of antibiotics.  Follow up as below.    Condition on d/c:  Improved   CBC Latest Ref Rng & Units 04/29/2016 04/28/2016 04/27/2016  WBC 4.0 - 10.5 K/uL 7.5 11.3([name] [name]) 16.1([name] [name])  Hemoglobin 13.0 - 17.0 g/dL 12.3(L) 13.2 14.2  Hematocrit 39.0 - 52.0 % 38.5(L) 41.4 43.4  Platelets 150 - 400 K/uL 291 298 279   CMP Latest Ref Rng & Units 04/28/2016 04/27/2016 04/26/2016  Glucose 65 - 99 mg/dL 85 - 131([name] [name])  BUN 6 - 20 mg/dL 18 - 13  Creatinine 0.61 - 1.24 mg/dL  1.23 1.13 1.18  Sodium 135 - 145 mmol/L 138 - 132(L)  Potassium 3.5 - 5.1 mmol/L 3.8 - 3.7  Chloride 101 - 111 mmol/L 104 - 95(L)  CO2 22 - 32 mmol/L 28 - 27  Calcium 8.9 - 10.3 mg/dL 8.7(L) - 9.1  Total Protein 6.5 - 8.1 g/dL - - 8.3([name] [name])  Total Bilirubin 0.3 - 1.2 mg/dL - - 0.7  Alkaline Phos 38 - 126 U/L - - 96  AST 15 - 41 U/L - - 24  ALT 17 - 63 U/L - - 29   Disposition: 01-Home or Self Care  Discharge Instructions    Ambulatory referral to Nutrition and Diabetic Education    Complete by:  As directed   New onset DM       Medication List    TAKE these medications   acetaminophen 325 MG tablet Commonly known as:  TYLENOL Take 2 tablets (650 mg total) by mouth every 6 (six) hours as needed for mild pain (or temp > 100).   amoxicillin-clavulanate 875-125 MG tablet Commonly known as:  AUGMENTIN Take 1 tablet by mouth every 12 (twelve) hours.   blood glucose meter kit and supplies Kit Dispense based on patient and insurance preference. Use up to four times daily as directed. (FOR ICD-9 250.00, 250.01).   ibuprofen 200 MG tablet Commonly known as:  ADVIL,MOTRIN Take 600 mg  by mouth every 6 (six) hours as needed for moderate pain.   metFORMIN 500 MG tablet Commonly known as:  GLUCOPHAGE Take 1 tablet (500 mg total) by mouth 2 (two) times daily with a meal.   saccharomyces boulardii 250 MG capsule Commonly known as:  FLORASTOR Take 1 twice a day for at least one week after you complete your antibiotics. You can by this at any drugstore over-the-counter.      Follow-up Information    Call your Primary care doctor to follow up on your diabetes. .   Contact information: You should see her or him in 1-2 weeks.       [name] Andon H, MD Follow up on 05/14/2016.   Specialty:  General Surgery Why:  Your appointment is at 2:45PM, be at the office 30 minutes early for check in.  They may have you go to lab for follow up labs before. If your primary care doctor can do this  that will be fine.   Contact information: [address] [place] [address] [phone]           Signed: Earnstine [name] [name] 05/05/2016, 3:58 PM  Agree with above.  Alphonsa Overall, MD, [facility] Pager: [phone] Office phone:  [phone]

## 2017-04-05 IMAGING — CT CT ABD-PELV W/ CM
2 of 4 series · 15 of 46 positions shown, 17 images · IV contrast (APPLIED)
Comparison: Abdominal radiograph performed 04/26/2016

CLINICAL DATA: Acute onset of suprapubic pain and constipation.
Fever. Initial encounter.

EXAM:
CT ABDOMEN AND PELVIS WITH CONTRAST
TECHNIQUE: Multidetector CT imaging of the abdomen and pelvis was performed
using the standard protocol following bolus administration of
intravenous contrast.
CONTRAST:  100mL ADG2LN-ZOO IOPAMIDOL (ADG2LN-ZOO) INJECTION 61%

[Series 2: axial st · axial · 0.98mm/px · z∈[+846,+1336]mm · 12 of 108 slices shown, 14 images]
[im 5/108  soft-tissue]
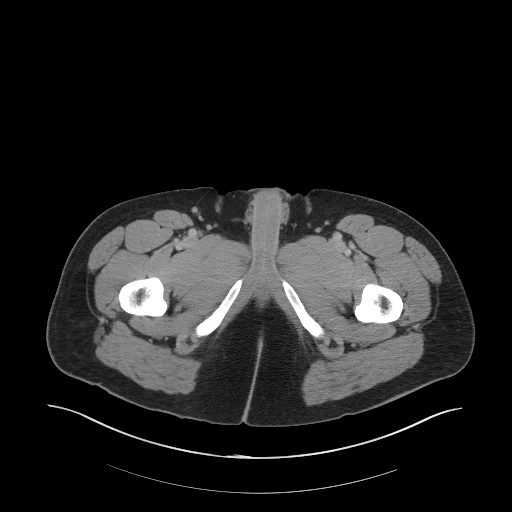
[im 5/108  bone]
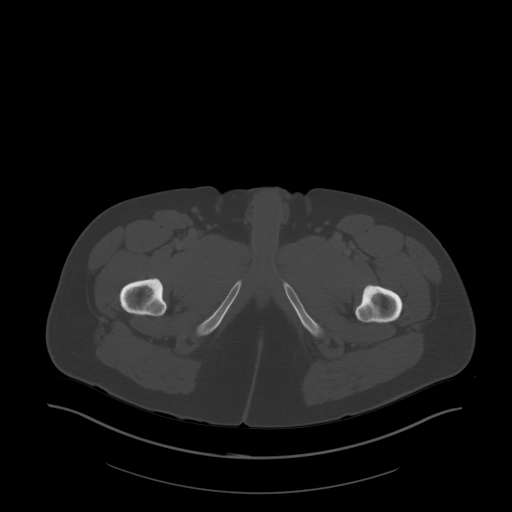
[im 14/108  soft-tissue]
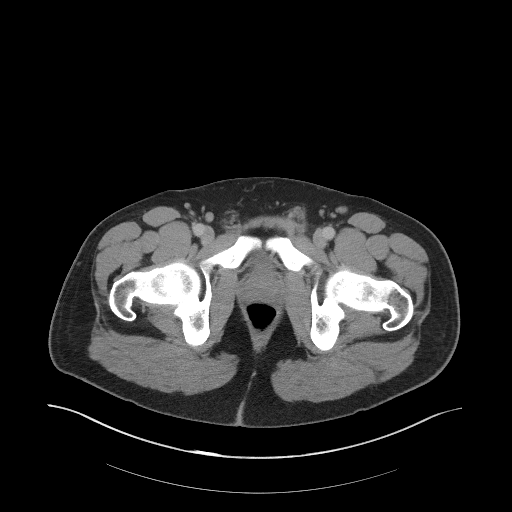
[im 23/108  soft-tissue]
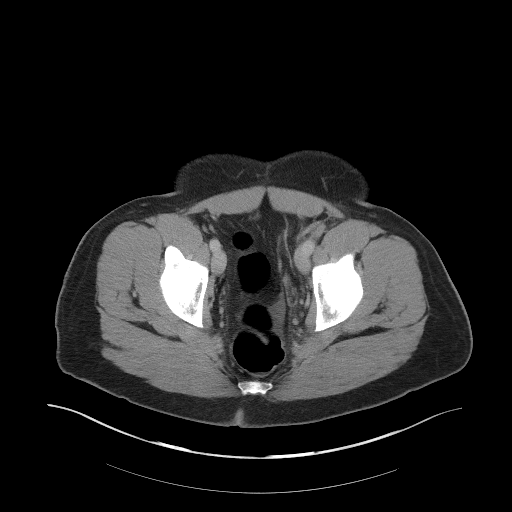
[im 32/108  soft-tissue]
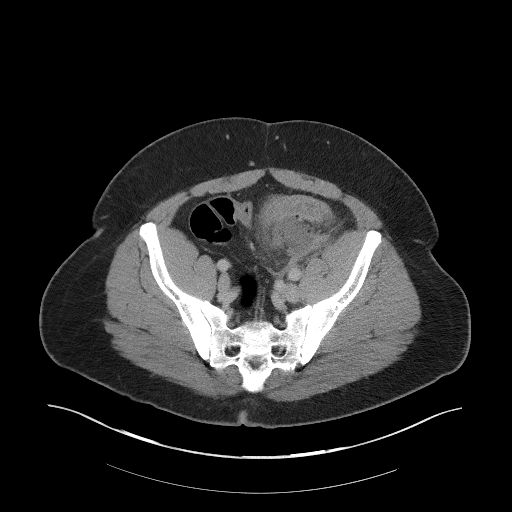
[im 41/108  soft-tissue]
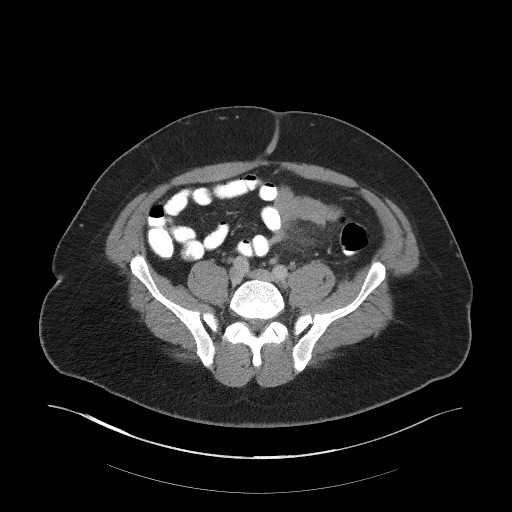
[im 50/108  soft-tissue]
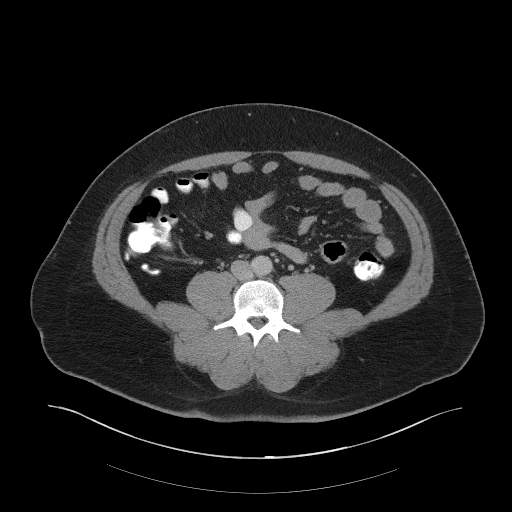
[im 58/108  soft-tissue]
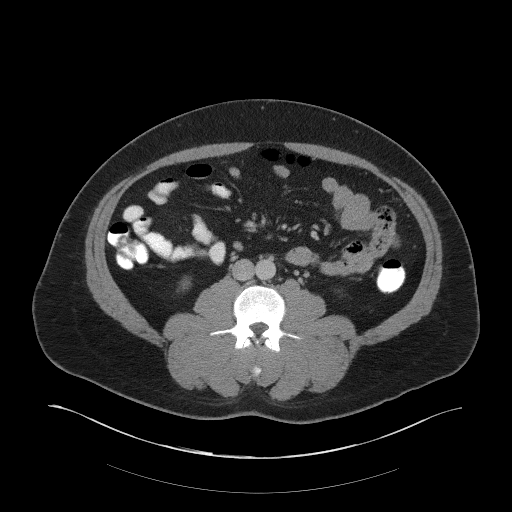
[im 67/108  soft-tissue]
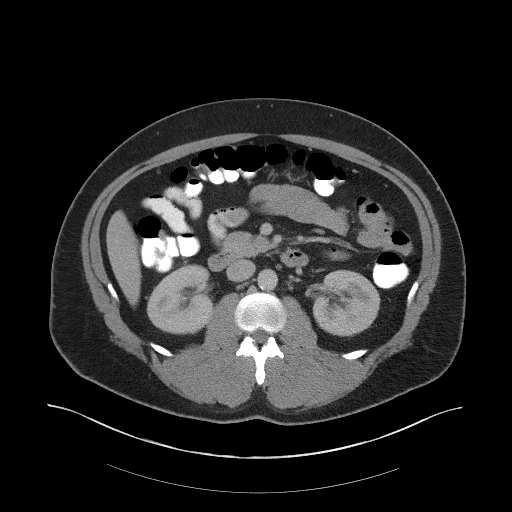
[im 76/108  soft-tissue]
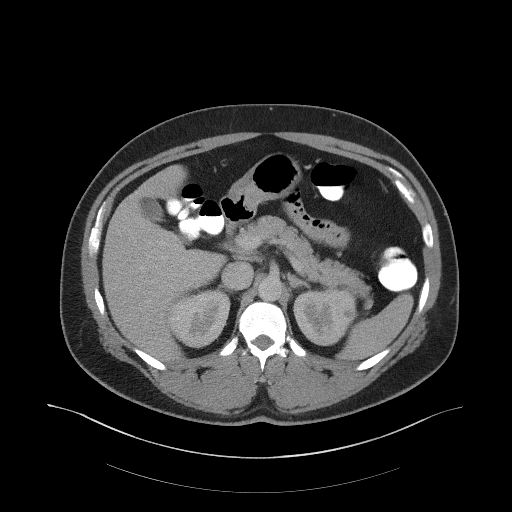
[im 76/108  bone]
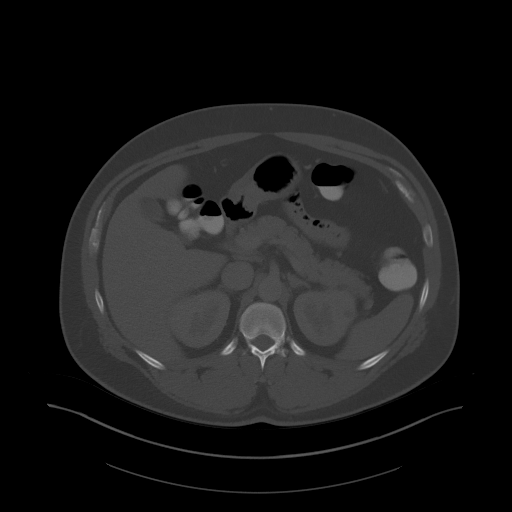
[im 85/108  soft-tissue]
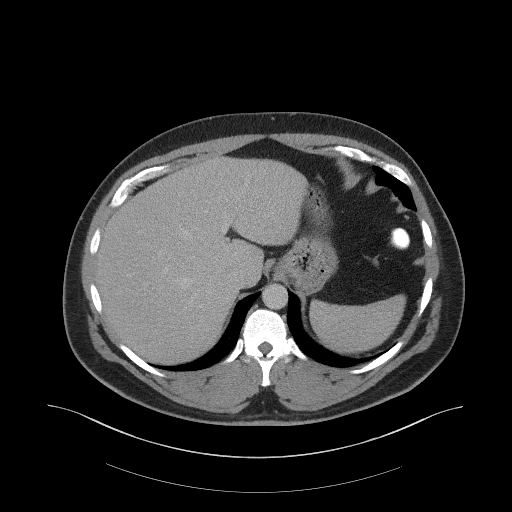
[im 94/108  soft-tissue]
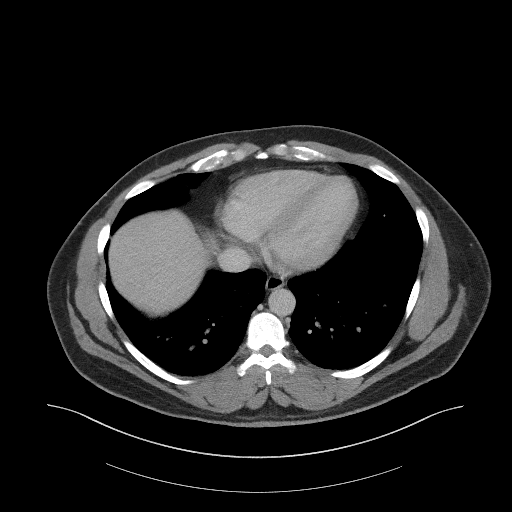
[im 103/108  soft-tissue]
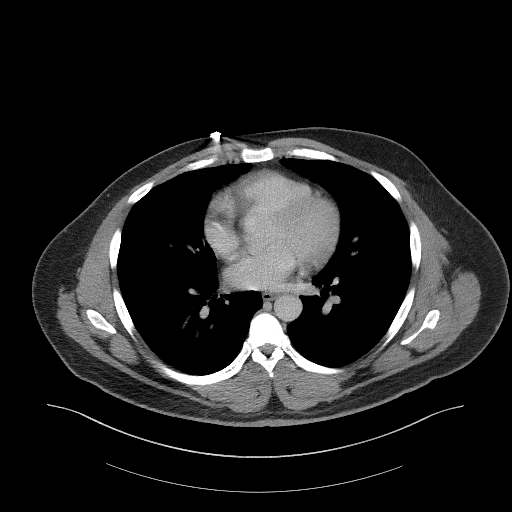

[Series 5: coronal st · coronal · 0.79mm/px · 3 of 88 slices shown]
[im 30/88  soft-tissue]
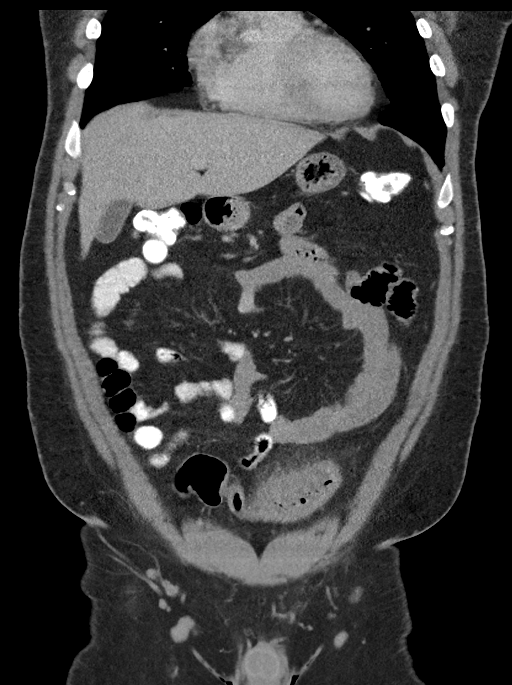
[im 39/88  soft-tissue]
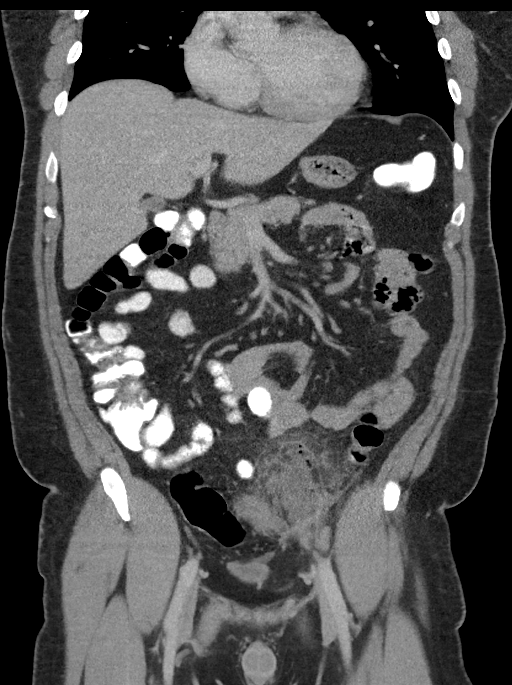
[im 49/88  soft-tissue]
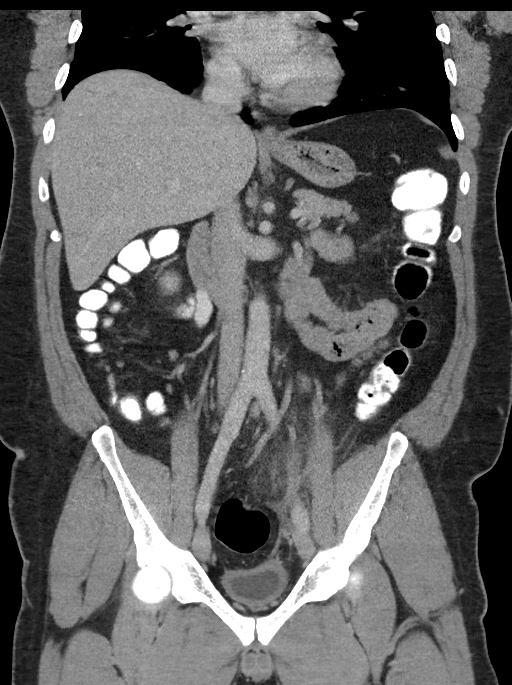

[15 of 46 positions shown; findings below may reference images not displayed]

FINDINGS: The visualized lung bases are clear.

The liver and spleen are unremarkable in appearance. The gallbladder
is within normal limits. The pancreas and adrenal glands are
unremarkable.

A few small left renal cysts are noted. There is no evidence of
hydronephrosis. No renal or ureteral stones are seen. No perinephric
stranding is appreciated.

No free fluid is identified. The small bowel is unremarkable in
appearance. The stomach is within normal limits. No acute vascular
abnormalities are seen.

The appendix is normal in caliber, without evidence of appendicitis.

There is a focal collection of fluid and inflammation posterior to
the proximal sigmoid colon, with tiny foci of free air, tracking
along the mesentery. The collection of fluid measures approximately
5.3 x 4.2 x 4.0 cm. No significant peripheral enhancement is yet
seen, though this is suggestive of evolving abscess secondary to
contained perforated diverticulitis. There is associated wall
thickening at the proximal sigmoid colon.

Free fluid and air remains contained at the left lower quadrant,
along the mesentery.

The bladder is relatively decompressed and grossly unremarkable. The
prostate remains normal in size. No inguinal lymphadenopathy is
seen.

No acute osseous abnormalities are identified.
IMPRESSION: 1. Focal collection of fluid and inflammation posterior to the
proximal sigmoid colon, with tiny foci of free air. The collection
of fluid measures approximately 5.3 x 4.2 x 4.0 cm. No significant
peripheral enhancement yet seen, though this is suggestive of
evolving abscess secondary to contained perforated diverticulitis.
Associated wall thickening at the proximal sigmoid colon. The free
fluid remains contained at the left lower quadrant at this time,
along the mesentery.
2. Few small left renal cysts noted.
Critical Value/emergent results were called by telephone at the time
of interpretation on 04/27/2016 at [DATE] to Dr. Jawo Maheen, who
verbally acknowledged these results.

## 2020-05-14 ENCOUNTER — Other Ambulatory Visit: Payer: Self-pay

## 2020-05-14 ENCOUNTER — Other Ambulatory Visit: Payer: 59

## 2020-05-14 DIAGNOSIS — Z20822 Contact with and (suspected) exposure to covid-19: Secondary | ICD-10-CM

## 2020-05-16 LAB — NOVEL CORONAVIRUS, NAA: SARS-CoV-2, NAA: NOT DETECTED

## 2020-05-16 LAB — SARS-COV-2, NAA 2 DAY TAT

## 2022-04-13 ENCOUNTER — Encounter (HOSPITAL_BASED_OUTPATIENT_CLINIC_OR_DEPARTMENT_OTHER): Payer: Self-pay | Admitting: Emergency Medicine

## 2022-04-13 ENCOUNTER — Emergency Department (HOSPITAL_BASED_OUTPATIENT_CLINIC_OR_DEPARTMENT_OTHER)
Admission: EM | Admit: 2022-04-13 | Discharge: 2022-04-13 | Disposition: A | Discharge status: Home / Self Care | Payer: BC Managed Care – PPO | Attending: Emergency Medicine | Admitting: Emergency Medicine

## 2022-04-13 ENCOUNTER — Other Ambulatory Visit: Payer: Self-pay

## 2022-04-13 DIAGNOSIS — T7840XA Allergy, unspecified, initial encounter: Secondary | ICD-10-CM | POA: Diagnosis present

## 2022-04-13 DIAGNOSIS — E119 Type 2 diabetes mellitus without complications: Secondary | ICD-10-CM | POA: Insufficient documentation

## 2022-04-13 DIAGNOSIS — R21 Rash and other nonspecific skin eruption: Secondary | ICD-10-CM | POA: Insufficient documentation

## 2022-04-13 DIAGNOSIS — Z7984 Long term (current) use of oral hypoglycemic drugs: Secondary | ICD-10-CM | POA: Diagnosis not present

## 2022-04-13 NOTE — ED Triage Notes (Signed)
Patient states he was given amoxicillin by the dentist and started breaking out and itching. Denies any shortness of breath or difficulty swallowing.

## 2022-04-13 NOTE — Discharge Instructions (Addendum)
Stop taking Penicillin. Take Cetirizine twice daily. You can take 25 mg of benadryl at night. Try to take earlier in the  night around 8 pm to prevent drowsiness in the morning.

## 2022-04-13 NOTE — ED Provider Notes (Signed)
[facility] Provider Note   CSN: [id number] Arrival date & time: 04/13/22  2134     History Medical history of diabetes  Chief Complaint  Patient presents with   Allergic Reaction    [name] [name] is a 41 y.o. male.  States that he has had 1 day of itchy rash all over his body.  He states that he has been on penicillin for dental infection for about 7 days now.  He says he last took this this morning.  He has taken cetirizine once today without any relief.  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  He has to wake up at 6 AM. Denies any other recent changes in soaps or laundry detergents.  No other obvious triggers that he can think of other than the antibiotics. He denies any facial swelling, difficulty swallowing, shortness of breath, nausea, vomiting, or chest pain.   Allergic Reaction Presenting symptoms: rash        Home Medications Prior to Admission medications   Medication Sig Start Date End Date Taking? Authorizing Provider  acetaminophen (TYLENOL) 325 MG tablet Take 2 tablets (650 mg total) by mouth every 6 (six) hours as needed for mild pain (or temp > 100). 04/30/16   Earnstine Regal, PA-C  amoxicillin-clavulanate (AUGMENTIN) 875-125 MG tablet Take 1 tablet by mouth every 12 (twelve) hours. 04/30/16   Earnstine Regal, PA-C  blood glucose meter kit and supplies KIT Dispense based on patient and insurance preference. Use up to four times daily as directed. (FOR ICD-9 250.00, 250.01). 04/30/16   Earnstine Regal, PA-C  ibuprofen (ADVIL,MOTRIN) 200 MG tablet Take 600 mg by mouth every 6 (six) hours as needed for moderate pain.    [provider]  metFORMIN (GLUCOPHAGE) 500 MG tablet Take 1 tablet (500 mg total) by mouth 2 (two) times daily with a meal. 04/30/16   Earnstine Regal, PA-C  saccharomyces boulardii (FLORASTOR) 250 MG capsule Take 1 twice a day for at least one week  after you complete your antibiotics. You can by this at any drugstore over-the-counter. 04/30/16   Earnstine Regal, PA-C      Allergies    Penicillins    Review of Systems   Review of Systems  Skin:  Positive for rash.  All other systems reviewed and are negative.   Physical Exam Updated Vital Signs BP (!) 158/92 (BP Location: Right Arm)   Pulse 76   Temp 98 F (36.7 C) (Oral)   Resp 16   Ht 6' 1"  (1.854 m)   Wt 97.5 kg   SpO2 99%   BMI 28.37 kg/m  Physical Exam Vitals and nursing note reviewed.  Constitutional:      General: He is not in acute distress.    Appearance: Normal appearance. He is well-developed. He is not ill-appearing, toxic-appearing or diaphoretic.  HENT:     Head: Normocephalic and atraumatic.     Nose: No nasal deformity.     Mouth/Throat:     Lips: Pink. No lesions.  Eyes:     General: Gaze aligned appropriately. No scleral icterus.       Right eye: No discharge.        Left eye: No discharge.     Conjunctiva/sclera: Conjunctivae normal.     Right eye: Right conjunctiva is not injected. No exudate or hemorrhage.    Left eye: Left conjunctiva is not injected. No exudate or hemorrhage. Pulmonary:  Effort: Pulmonary effort is normal. No respiratory distress.  Skin:    General: Skin is warm and dry.     Comments: Urticarial rash along torso, and bilateral upper extremities.  Seems to be fading.  Neurological:     Mental Status: He is alert and oriented to person, place, and time.  Psychiatric:        Mood and Affect: Mood normal.        Speech: Speech normal.        Behavior: Behavior normal. Behavior is cooperative.     ED Results / Procedures / Treatments   Labs (all labs ordered are listed, but only abnormal results are displayed) Labs Reviewed - No data to display  EKG None  Radiology No results found.  Procedures Procedures   Medications Ordered in ED Medications - No data to display  ED Course/ Medical Decision Making/  A&P                           Medical Decision Making  Patient presents for likely allergic reaction to penicillin.  He has an urticarial upper extremity itchy rash after being on penicillin for 7 days.  No other triggers that we can note.  The rash itself actually seems to be improving throughout the day.  He took cetirizine 1 time today. I have recommended cessation of Penicillin. Also recommend continuing Cetirizine twice daily. Take Benadryl at night before bed. Recommend contacting dentist for further recommendations on alternative antibiotics.  I have added allergy to record.   Final Clinical Impression(s) / ED Diagnoses Final diagnoses:  Allergic reaction, initial encounter    Rx / DC Orders ED Discharge Orders     None         Adolphus Birchwood, PA-C 04/13/22 2241    Malvin Johns, MD 04/13/22 2249
